# Patient Record
Sex: Female | Born: 1975 | Race: White | Hispanic: Yes | Marital: Single | Smoking: Never smoker
Health system: Southern US, Community
[De-identification: ages and names within clinical notes are randomized; demographics above are authoritative.]

## PROBLEM LIST (undated history)

## (undated) ENCOUNTER — Emergency Department (HOSPITAL_BASED_OUTPATIENT_CLINIC_OR_DEPARTMENT_OTHER): Admission: EM | Payer: No Typology Code available for payment source

## (undated) DIAGNOSIS — M549 Dorsalgia, unspecified: Secondary | ICD-10-CM

## (undated) DIAGNOSIS — M542 Cervicalgia: Secondary | ICD-10-CM

## (undated) HISTORY — DX: Dorsalgia, unspecified: M54.9

## (undated) HISTORY — PX: TUBAL LIGATION: SHX77

## (undated) HISTORY — DX: Cervicalgia: M54.2

---

## 2005-09-08 ENCOUNTER — Other Ambulatory Visit: Admission: RE | Admit: 2005-09-08 | Discharge: 2005-09-08 | Payer: Self-pay | Admitting: Gynecology

## 2007-08-03 ENCOUNTER — Other Ambulatory Visit: Admission: RE | Admit: 2007-08-03 | Discharge: 2007-08-03 | Payer: Self-pay | Admitting: Gynecology

## 2007-11-08 ENCOUNTER — Inpatient Hospital Stay (HOSPITAL_COMMUNITY): Admission: AD | Admit: 2007-11-08 | Discharge: 2007-11-08 | Payer: Self-pay | Admitting: Obstetrics and Gynecology

## 2007-11-19 ENCOUNTER — Inpatient Hospital Stay (HOSPITAL_COMMUNITY): Admission: AD | Admit: 2007-11-19 | Discharge: 2007-11-20 | Payer: Self-pay | Admitting: Family Medicine

## 2007-11-23 ENCOUNTER — Ambulatory Visit: Payer: Self-pay | Admitting: Obstetrics and Gynecology

## 2007-11-23 ENCOUNTER — Encounter: Payer: Self-pay | Admitting: Obstetrics and Gynecology

## 2011-02-16 NOTE — Group Therapy Note (Signed)
NAME:  Kristine Hayden, [name] NO.:  [id number]   MEDICAL RECORD NO.:  [id number]          PATIENT TYPE:  WOC   LOCATION:  [facility]                   FACILITY:  [facility]   PHYSICIAN:  Deirdre Christy Gentles, CNM       DATE OF BIRTH:  1976-08-06   DATE OF SERVICE:                                  CLINIC NOTE   REASON FOR VISIT:  Kristine Hayden is referred here from maternity  admissions where she has been seen twice this month for multiple  complaints, primarily constipation and muscle pain, neck, back, legs.  She was seen on February 4 for abdominal pain and diagnosed with muscle  spasm and constipation.  The she was treated with Toradol, Flexeril,  Colace and also received Rocephin for cervicitis.  Note, GC and  chlamydia were done were negative at that visit.  She was then seen  February 15 in MAU of for her constipation and had a manual disimpaction  of stool followed by an enema with good results.  She has a prescription  which she is taking for MiraLax.  However, she still says she is  constipated and she strains with stool and has hard stools.  She has a  bowel movement usually about every 2 days but has not had another bowel  movement since she was seen 4 days ago in MAU.  She is also concerned  with vaginal discharge which is white and itching which is on and off.  She also has dyspareunia.   GYN HISTORY:  Menses 14 x 28 x 5 with moderate flow and with moderate  dysmenorrhea.  She is using no contraception at present and says she  would like to have a fifth child but is not actively trying to get  pregnant now.   OBSTETRICAL HISTORY:  She is a G5, P 4-0-1-4 had SAB 6 months ago.  Children's ages are 35, 56, 4 and 3.  She probably had a Pap smear after  her 54-year-old's birth.  Is not sure.   SURGICAL HISTORY:  None.   FAMILY HISTORY:  Positive for heart disease and hypertension, and  negative for diabetes and cancer.   MEDICAL HISTORY:  She states she had pneumonia in the  past.   SOCIAL HISTORY:  Lives with father of her babies.  Is employed and a  nonsmoker.  Does not drink alcohol.  Negative history of abuse.  She  does report being under stress.   REVIEW OF SYMPTOMS:  Is positive as noted above in HPI as well as having  numbness in her fingers, pain in her muscles, fatigue, weight loss,  headaches, dizziness.  Chest pain dysuria and stress urinary  incontinence.   ALLERGIES:  None.   MEDICATIONS:  Nitrofurantoin, MiraLax, Flexeril, Colace.   PHYSICAL EXAM:  Anxious Hispanic female in no apparent distress.  HEENT:  WNL.  NECK:  No thyromegaly.  LUNGS:  Clear to auscultation bilaterally.  HEART:  RRR without murmur.  ABDOMEN:  Soft, flat, minimal diffuse tenderness which is nonfocal.  No  masses or organomegaly appreciated.  No CVA tenderness.  She does have  paraspinous tenderness  from cervical to sacral area, also tight muscles  in the suprascapular region.  Skin is without lesions.  EXTREMITIES:  2+ pulses without any edema.  BREASTS:  No discrete masses or lymphadenopathy.  PELVIC:  NEFG, physiologic discharge, which is white.  No lesions noted  at the vulvar introitus.  Vagina well rugated.  Cervix posterior clean  parous os.  Pap is done.  Uterus NSSP no CMT.  No adnexal tenderness or  masses.  No stool appreciated in rectal vault.   LABORATORY DATA:  The patient has had recent negative GC and negative  chlamydia this month.  She had a negative wet prep on February 4.  Urinalysis on 02/16 was significant for small LE, three to six WBCs and  many bacteria with few squamous cells, no culture was sent.  UPT was  negative at both of these visits February 4 and February 16.  Was not  repeated today.  Her CBC is WNL with hemoglobin of 13.2.   ASSESSMENT:  Stress and anxiety with secondary muscle tightness and  aching.  Constipation improved with treatment.  Normal GYN exam.   PLAN:  Interpreter was present only for the first part of this  visit and  we reviewed the medications and discuss constipation at length including  dietary measures, addition of fiber in her diet, as well as using  FiberCon or Metamucil if she wishes.  She is okay to use an occasional  Fleet's enema but meanwhile, should just stick to the MiraLax and the  Colace which she already has.  She is reassured about normalcy of her  exam and advised to try Tylenol with ibuprofen for her multiple aches  and pains.  Instructed in Kegel exercises, though her vaginal tone and  support are fair by exam.  She is advised to return in 4-6 weeks if she  is not any better.  Otherwise will come back for Pap smear in 1 year.           ______________________________  Caren Griffins, CNM     DP/MEDQ  D:  11/23/2007  T:  11/24/2007  Job:  [id number]

## 2011-06-25 LAB — URINE MICROSCOPIC-ADD ON

## 2011-06-25 LAB — URINALYSIS, ROUTINE W REFLEX MICROSCOPIC
Bilirubin Urine: NEGATIVE
Glucose, UA: NEGATIVE
Nitrite: NEGATIVE
Nitrite: NEGATIVE
Specific Gravity, Urine: 1.02
Urobilinogen, UA: 0.2
pH: 5.5
pH: 7

## 2011-06-25 LAB — CBC
MCHC: 34.6
MCV: 84.2
Platelets: 258

## 2011-06-25 LAB — GC/CHLAMYDIA PROBE AMP, GENITAL: GC Probe Amp, Genital: NEGATIVE

## 2011-06-25 LAB — WET PREP, GENITAL

## 2013-06-11 ENCOUNTER — Inpatient Hospital Stay (HOSPITAL_COMMUNITY)
Admission: AD | Admit: 2013-06-11 | Discharge: 2013-06-11 | Disposition: A | Discharge status: Home / Self Care | Payer: Self-pay | Source: Ambulatory Visit | Attending: Obstetrics & Gynecology | Admitting: Obstetrics & Gynecology

## 2013-06-11 ENCOUNTER — Encounter (HOSPITAL_COMMUNITY): Payer: Self-pay | Admitting: *Deleted

## 2013-06-11 DIAGNOSIS — L293 Anogenital pruritus, unspecified: Secondary | ICD-10-CM | POA: Insufficient documentation

## 2013-06-11 DIAGNOSIS — B3731 Acute candidiasis of vulva and vagina: Secondary | ICD-10-CM | POA: Insufficient documentation

## 2013-06-11 DIAGNOSIS — N949 Unspecified condition associated with female genital organs and menstrual cycle: Secondary | ICD-10-CM | POA: Insufficient documentation

## 2013-06-11 DIAGNOSIS — R300 Dysuria: Secondary | ICD-10-CM | POA: Insufficient documentation

## 2013-06-11 DIAGNOSIS — B373 Candidiasis of vulva and vagina: Secondary | ICD-10-CM

## 2013-06-11 DIAGNOSIS — N390 Urinary tract infection, site not specified: Secondary | ICD-10-CM

## 2013-06-11 DIAGNOSIS — R350 Frequency of micturition: Secondary | ICD-10-CM | POA: Insufficient documentation

## 2013-06-11 LAB — WET PREP, GENITAL
Clue Cells Wet Prep HPF POC: NONE SEEN
Trich, Wet Prep: NONE SEEN

## 2013-06-11 LAB — URINALYSIS, ROUTINE W REFLEX MICROSCOPIC
Bilirubin Urine: NEGATIVE
Glucose, UA: NEGATIVE mg/dL
Ketones, ur: NEGATIVE mg/dL
Specific Gravity, Urine: 1.02 (ref 1.005–1.030)
pH: 7 (ref 5.0–8.0)

## 2013-06-11 LAB — POCT PREGNANCY, URINE: Preg Test, Ur: NEGATIVE

## 2013-06-11 MED ORDER — CIPROFLOXACIN HCL 500 MG PO TABS: 500.0000 mg | ORAL_TABLET | Freq: Two times a day (BID) | ORAL | Status: DC

## 2013-06-11 MED ORDER — FLUCONAZOLE 150 MG PO TABS
150.0000 mg | ORAL_TABLET | ORAL | Status: AC
  Administered 2013-06-11: 150 mg via ORAL
  Filled 2013-06-11: qty 1

## 2013-06-11 MED ORDER — FLUCONAZOLE 150 MG PO TABS: 150.0000 mg | ORAL_TABLET | Freq: Once | ORAL | Status: DC

## 2013-06-11 NOTE — MAU Provider Note (Signed)
Chief Complaint: Vaginal Pain   None    SUBJECTIVE HPI: Kristine Hayden is a 37 y.o. G8P0020 at who presents to maternity admissions reporting pain with urination, urinary frequency, and feeling like she has a bump near where she urinates.  She also has vaginal itching.  She denies vaginal bleeding, h/a, dizziness, n/v, or fever/chills.  Hospital Spanish translator used for all communication.   History reviewed. No pertinent past medical history. Past Surgical History  Procedure Laterality Date  . Cesarean section     History   Social History  . Marital Status: Single    Spouse Name: N/A    Number of Children: N/A  . Years of Education: N/A   Occupational History  . Not on file.   Social History Main Topics  . Smoking status: Never Smoker   . Smokeless tobacco: Not on file  . Alcohol Use: No  . Drug Use: No  . Sexual Activity: Yes    Birth Control/ Protection: Surgical   Other Topics Concern  . Not on file   Social History Narrative  . No narrative on file   No current facility-administered medications on file prior to encounter.   No current outpatient prescriptions on file prior to encounter.   Allergies not on file  ROS: Pertinent items in HPI  OBJECTIVE Blood pressure 110/76, pulse 73, last menstrual period 05/23/2013. GENERAL: Well-developed, well-nourished female in no acute distress.  HEENT: Normocephalic HEART: normal rate RESP: normal effort ABDOMEN: Soft, non-tender EXTREMITIES: Nontender, no edema NEURO: Alert and oriented Pelvic exam: Cervix pink, visually closed, without lesion, small amount thick white discharge, vaginal walls and external genitalia normal, normal urethral opening, possibly with slight edema in area pt palpates as a "bump". Bimanual exam: Cervix 0/long/high, firm, anterior, neg CMT, uterus nontender, nonenlarged, adnexa without tenderness, enlargement, or mass  LAB RESULTS Results for orders placed during the  hospital encounter of 06/11/13 (from the past 24 hour(s))  URINALYSIS, ROUTINE W REFLEX MICROSCOPIC     Status: Abnormal   Collection Time    06/11/13 12:00 PM      Result Value Range   Color, Urine YELLOW  YELLOW   APPearance CLEAR  CLEAR   Specific Gravity, Urine 1.020  1.005 - 1.030   pH 7.0  5.0 - 8.0   Glucose, UA NEGATIVE  NEGATIVE mg/dL   Hgb urine dipstick MODERATE (*) NEGATIVE   Bilirubin Urine NEGATIVE  NEGATIVE   Ketones, ur NEGATIVE  NEGATIVE mg/dL   Protein, ur NEGATIVE  NEGATIVE mg/dL   Urobilinogen, UA 0.2  0.0 - 1.0 mg/dL   Nitrite NEGATIVE  NEGATIVE   Leukocytes, UA NEGATIVE  NEGATIVE  URINE MICROSCOPIC-ADD ON     Status: Abnormal   Collection Time    06/11/13 12:00 PM      Result Value Range   Squamous Epithelial / LPF FEW (*) RARE   RBC / HPF 3-6  <3 RBC/hpf   Bacteria, UA RARE  RARE  POCT PREGNANCY, URINE     Status: None   Collection Time    06/11/13 12:23 PM      Result Value Range   Preg Test, Ur NEGATIVE  NEGATIVE  WET PREP, GENITAL     Status: Abnormal   Collection Time    06/11/13 12:35 PM      Result Value Range   Yeast Wet Prep HPF POC NONE SEEN  NONE SEEN   Trich, Wet Prep NONE SEEN  NONE SEEN   Clue Cells  Wet Prep HPF POC NONE SEEN  NONE SEEN   WBC, Wet Prep HPF POC FEW (*) NONE SEEN    ASSESSMENT 1. UTI (lower urinary tract infection)   2. Vaginal candidiasis     PLAN Treatment based on symptoms Diflucan x1 dose in MAU Discharge home Urine sent for culture Cipro BID x3 days Drink plenty of water Return to MAU as needed    Medication List         ciprofloxacin 500 MG tablet  Commonly known as:  CIPRO  Take 1 tablet (500 mg total) by mouth 2 (two) times daily.         Medication List    Notice   You have not been prescribed any medications.       Sharen Counter Certified Nurse-Midwife 06/11/2013  1:40 PM

## 2013-06-11 NOTE — MAU Note (Signed)
Pt presents with complaints of burning, pain, and itching with urination.

## 2013-06-12 LAB — GC/CHLAMYDIA PROBE AMP: GC Probe RNA: NEGATIVE

## 2013-08-01 ENCOUNTER — Ambulatory Visit (INDEPENDENT_AMBULATORY_CARE_PROVIDER_SITE_OTHER): Payer: Self-pay | Admitting: Obstetrics & Gynecology

## 2013-08-01 ENCOUNTER — Encounter: Payer: Self-pay | Admitting: Obstetrics & Gynecology

## 2013-08-01 VITALS — BP 107/75 | HR 72 | Temp 98.2°F | Ht 61.0 in | Wt 145.2 lb

## 2013-08-01 DIAGNOSIS — N390 Urinary tract infection, site not specified: Secondary | ICD-10-CM

## 2013-08-01 DIAGNOSIS — L293 Anogenital pruritus, unspecified: Secondary | ICD-10-CM

## 2013-08-01 DIAGNOSIS — L292 Pruritus vulvae: Secondary | ICD-10-CM

## 2013-08-01 LAB — POCT URINALYSIS DIP (DEVICE)
Glucose, UA: NEGATIVE mg/dL
Protein, ur: 30 mg/dL — AB
Specific Gravity, Urine: >=1.03 (ref 1.005–1.030)
Urobilinogen, UA: 1 mg/dL (ref 0.0–1.0)

## 2013-08-01 MED ORDER — SULFAMETHOXAZOLE-TRIMETHOPRIM 800-160 MG PO TABS: 1.0000 | ORAL_TABLET | Freq: Two times a day (BID) | ORAL | Status: DC

## 2013-08-01 NOTE — Progress Notes (Signed)
Subjective:     Patient ID: Kristine Hayden, female   DOB: 1976-02-03, 37 y.o.   MRN: [id number]  HPI Pt c/o dysuria and urinary freq for several months.  She was seen in the ED and given an atbx- she is unsure whether it worked.  She c/o throbbing in the vaginal area.  The area that she points to is the clitoris.  She reports that she scratches in that area because it is so severe.  No past medical history on file.  Past Surgical History  Procedure Laterality Date  . Cesarean section      No current outpatient prescriptions on file prior to visit.   No current facility-administered medications on file prior to visit.   No Known Allergies     Review of Systems     Objective:   Physical Exam BP 107/75  Pulse 72  Temp(Src) 98.2 F (36.8 C) (Oral)  Ht 5\' 1"  (1.549 m)  Wt 145 lb 3.2 oz (65.862 kg)  BMI 27.45 kg/m2  LMP 07/23/2013 Pt in NAD Abd: soft NT, ND GU: EGBUS: no lesions; skin intact  The area this she points to show no abnormality  UA: mod blood, trace Leuk   Assessment:     Possible UTI Vulvar itching- normal exam      Plan:     Bactrim DS 1 po bid x 7 days Urine cx Hydrocortisone cream 1% to affected area tid  F/u 1 month Pt encouraged NOT to scratch the area before next visit

## 2013-08-01 NOTE — Patient Instructions (Signed)
Infeccin urinaria  (Urinary Tract Infection)  La infeccin urinaria puede ocurrir en Corporate treasurer del tracto urinario. El tracto urinario es un sistema de drenaje del cuerpo por el que se eliminan los desechos y el exceso de [place]. El tracto urinario est formado por dos riones, dos urteres, la vejiga y Engineer, mining. Los riones son rganos que tienen forma de frijol. Cada rin tiene aproximadamente el tamao del puo. Estn situados debajo de las [place], uno a cada lado de la columna vertebral CAUSAS  La causa de la infeccin son los microbios, que son organismos microscpicos, que incluyen hongos, virus, y bacterias. Estos organismos son tan pequeos que slo pueden verse a travs del microscopio. Las bacterias son los microorganismos que ms comnmente causan infecciones urinarias.  SNTOMAS  Los sntomas pueden variar segn la edad y el sexo del paciente y por la ubicacin de la infeccin. Los sntomas en las mujeres jvenes incluyen la necesidad frecuente e intensa de orinar y una sensacin dolorosa de ardor en la vejiga o en la uretra durante la miccin. Las mujeres y los hombres mayores podrn sentir cansancio, temblores y debilidad y Futures trader musculares y Engineer, mining abdominal. Si tiene [place], puede significar que la infeccin est en los riones. Otros sntomas son dolor en la espalda o en los lados debajo de las [place], nuseas y vmitos.  DIAGNSTICO  Para diagnosticar una infeccin urinaria, el mdico le preguntar acerca de sus sntomas. [employer] una [place] de [place]. La muestra de orina se analiza para Engineer, manufacturing bacterias y glbulos blancos de Risk manager. Los glbulos blancos se forman en el organismo para ayudar a Artist las infecciones.  TRATAMIENTO  Por lo general, las infecciones urinarias pueden tratarse con medicamentos. Debido a que la [employer] de las infecciones son causadas por bacterias, por lo general pueden tratarse con antibiticos. La eleccin del  antibitico y la duracin del tratamiento depender de sus sntomas y el tipo de bacteria causante de la infeccin.  INSTRUCCIONES PARA EL CUIDADO EN EL HOGAR   Si le recetaron antibiticos, tmelos exactamente como su mdico le indique. Termine el medicamento aunque se sienta mejor despus de haber tomado slo algunos.  Beba gran cantidad de lquido para mantener la orina de tono claro o color amarillo plido.  Evite la cafena, el t y las [address]. Estas sustancias irritan la vejiga.  Vaciar la vejiga con frecuencia. Evite retener la orina durante largos perodos.  Vace la vejiga antes y despus de Management consultant.  Despus de mover el intestino, las mujeres deben higienizarse la regin perineal desde adelante hacia atrs. Use slo un papel tissue por vez. SOLICITE ATENCIN MDICA SI:   Siente dolor en la espalda.  Le sube la fiebre.  Los sntomas no mejoran luego de [address]. SOLICITE ATENCIN MDICA DE INMEDIATO SI:   Siente dolor intenso en la espalda o en la zona inferior del abdomen.  Comienza a sentir escalofros.  Tiene nuseas o vmitos.  Tiene una sensacin continua de quemazn o molestias al [employer]. ASEGRESE DE QUE:   Comprende estas instrucciones.  Controlar su enfermedad.  Solicitar ayuda de inmediato si no mejora o empeora. Document Released: 06/30/2005 Document Revised: 06/14/2012 [facility] Patient Information 2014 [place], [place].   Hydrocortisone 1% apply to affected area 3x a day

## 2013-08-22 ENCOUNTER — Ambulatory Visit: Payer: Self-pay | Admitting: Obstetrics & Gynecology

## 2013-09-05 ENCOUNTER — Ambulatory Visit: Payer: Self-pay | Admitting: Obstetrics & Gynecology

## 2013-09-06 ENCOUNTER — Ambulatory Visit: Payer: Self-pay | Admitting: Obstetrics & Gynecology

## 2013-09-07 ENCOUNTER — Ambulatory Visit (INDEPENDENT_AMBULATORY_CARE_PROVIDER_SITE_OTHER): Payer: Self-pay | Admitting: Obstetrics & Gynecology

## 2013-09-07 ENCOUNTER — Encounter: Payer: Self-pay | Admitting: Obstetrics & Gynecology

## 2013-09-07 VITALS — BP 110/78 | HR 69 | Temp 97.3°F | Ht 60.0 in | Wt 147.6 lb

## 2013-09-07 DIAGNOSIS — R350 Frequency of micturition: Secondary | ICD-10-CM

## 2013-09-07 DIAGNOSIS — R3129 Other microscopic hematuria: Secondary | ICD-10-CM

## 2013-09-07 DIAGNOSIS — N949 Unspecified condition associated with female genital organs and menstrual cycle: Secondary | ICD-10-CM

## 2013-09-07 DIAGNOSIS — R102 Pelvic and perineal pain: Secondary | ICD-10-CM

## 2013-09-07 LAB — POCT URINALYSIS DIP (DEVICE)
Bilirubin Urine: NEGATIVE
Glucose, UA: NEGATIVE mg/dL
Nitrite: NEGATIVE
Specific Gravity, Urine: 1.015 (ref 1.005–1.030)
pH: 7 (ref 5.0–8.0)

## 2013-09-07 MED ORDER — TRAMADOL HCL 50 MG PO TABS: 50.0000 mg | ORAL_TABLET | Freq: Four times a day (QID) | ORAL | Status: DC | PRN

## 2013-09-07 MED ORDER — TOLTERODINE TARTRATE ER 2 MG PO CP24: 2.0000 mg | ORAL_CAPSULE | Freq: Every day | ORAL | Status: DC

## 2013-09-07 NOTE — Patient Instructions (Signed)
Vejiga hiperactiva - Adultos  (Overactive Bladder, Adult)  La vejiga tiene dos funciones que son totalmente opuestas. Una de ellas es relajarse y agrandarse para Psychologist, clinical orina (se llena como un globo), y la otra es contraerse y apretar hacia abajo de modo que pueda vaciar la orina que se ha almacenado. El correcto funcionamiento de la vejiga es una mezcla compleja de [place] funciones. El llenado y vaciado de la vejiga puede estar influenciado por:   La vejiga.  La mdula espinal.  El cerebro.  Los nervios que Zenaida Niece a la vejiga.  Otros rganos estrechamente relacionados con la vejiga, como la prstata en los hombres y la vagina en las mujeres. A medida que la vejiga se llena de orina, enva seales nerviosas al cerebro para informarle que tiene que orinar. La miccin normal requiere que la vejiga se contraiga hacia abajo con la fuerza suficiente como para vaciarla, pero tambin requiere que se contraiga el tiempo suficiente como para finalizar la accin. Adems, los msculos del esfnter, que normalmente impiden las fugas de [place], tambin deben relajarse para que la orina pueda pasar. Es necesaria la coordinacin entre el msculo de la vejiga que empuja hacia abajo y los msculos del esfnter que se relajan para que todo ocurra con normalidad.  En una vejiga hiperactiva los msculos se contraen de forma inesperada e involuntaria y esto provoca la necesidad urgente de Geographical information systems officer. La respuesta normal es tratar de Insurance risk surveyor orina contrayendo los msculos del esfnter. A veces, la vejiga se contrae con tanta fuerza que los msculos del esfnter no pueden impedir que la orina pase y se produce la incontinencia. Este tipo de incontinencia se llama incontinencia a la urgencia.  Cuando la vejiga es hiperactiva puede haber situaciones embarazosas. Puede interferir en vivir la vida de la manera que usted desea. Muchas personas piensan que es algo que deben soportar debido al envejecimiento o a problemas de  [place]. Sin embargo, existen tratamientos que pueden ayudar a Radio producer su vida menos complicada y ms agradable.  CAUSAS  Son [employer] factores que pueden producir una vejiga hiperactiva. Ellos son:   Infeccin del tracto urinario o infeccin de los tejidos cercanos, como la prstata.  Agrandamiento de la prstata.  En las mujeres, los embarazos mltiples o una ciruga en el tero o la uretra.  Clculos , inflamacin o tumores en la vejiga.  La cafena.  El alcohol.  Medicamentos. Por ejemplo, los diurticos (medicamentos que ayudan al cuerpo a Animator del exceso de lquido) aumentan la produccin de [place]. Algunos medicamentos que deben tomarse con mucho lquido.  Debilidad muscular o nerviosa. Esto podra ser el resultado de una lesin de la mdula espinal, un ictus, esclerosis mltiple o enfermedad de Parkinson.  La diabetes puede producir un alto volumen de orina que llena la vejiga tan rpidamente que el impulso normal de orinar se activa muy intensamente. SNTOMAS   Prdida del control de la vejiga. Siente necesidad de Geographical information systems officer y no Sport and exercise psychologist.  Urgencia repentina de orinar.  Orina 8 o ms veces por da.  Levantarse para [employer] o ms veces por noche. DIAGNSTICO  Para diagnosticar vejiga hiperactiva, el mdico probablemente:   Preguntar sobre los sntomas que ha notado.  Preguntar acerca de su salud en general. Incluir preguntas sobre cualquier medicamento que est tomando.  Realizar un examen fsico. Esto ayudar a determinar si hay obstrucciones evidentes u otros problemas.  Indicar algunos exmenes. Pueden incluir:  Un anlisis de sangre para detectar diabetes u otros  problemas de salud que podran estar contribuyendo al problema.  Anlisis de [place]. Incluir la medicin del flujo de [place] y la presin sobre la vejiga.  Un estudio del sistema neurolgico (cerebro, mdula espinal y nervios). Este es el sistema que detecta la necesidad de Geographical information systems officer. Algunas  de [employer] son las pruebas de flujo, pruebas de presin en la vejiga y mediciones elctricas del msculo del Corporate investment banker.  Un estudio de la vejiga para comprobar si se vaca completamente al [employer]oco[employer]illips.  Una citoscopa. En este estudio se [place] un tubo delgado con una pequea cmara. Ofrece la visin del interior de la uretra y la vejiga para ver si hay algn problema.  Diagnstico por imgenes. Le administrarn una sustancia de contraste y The [employer]JX Companies pedirn que orine. Se toman radiografas para ver el funcionamiento de la vejiga. TRATAMIENTO  Una vejiga hiperactiva puede tratarse de [employer]. El tratamiento depende de la causa. Tambin vara si su caso es leve o grave. Generalmente el tratamiento se puede administrar en el consultorio mdico o en la clnica. Asegrese de [employer] opciones con su mdico. Las opciones pueden ser:   Tratamientos conductuales. No incluyen medicamentos ni ciruga:  Entrenamiento de la vejiga. En este caso debe seguir un programa de horarios para orinar a intervalos regulares. Esto le ayudar a aprender a Chief Operating Officer las ganas de Geographical information systems officer. Al principio, es posible que le indiquen que espere unos minutos despus de sentir deseos de Geographical information systems officer. Con el tiempo, usted debe ser capaz de programar ir al bao con ms de una hora de distancia.  Ejercicios de Kegel. Estos ejercicios fortalecen los msculos del piso plvico, que sostienen la vejiga. Al tonificar estos msculos, podr controlar la necesidad de orinar, incluso si los msculos de la vejiga son hiperactivos. Un especialista le ensear cmo hacer estos ejercicios correctamente. Se requerir prctica diaria.  Prdida de peso. Si usted es obeso o tiene sobrepeso, perder peso podra mejorar la vejiga hiperactiva. Hable con su mdico acerca de cuntos kilos debe perder. Tambin pregunte si hay un programa o mtodo especfico que funcione mejor para usted.  Cambio de dieta. Podran sugerirle esta opcin si el  estreimiento empeora su vejiga hiperactiva. El mdico o un nutricionista pueden explicarle las formas de modificar su dieta para Educational psychologist. Otras personas mejorarn si toman menos cafena o alcohol. En algunos casos es necesario beber menos lquidos.  Proteccin. Esto no es Art therapist. Sin embargo, podra usar apsitos especiales para detener eventuales prdidas mientras espera que otros tratamientos sean efectivos. Esto le ayudar a evitar situaciones vergonzantes.  Tratamientos fsicos.  Estimulacin elctrica. Los electrodos envan [employer] Sciences Corporation a los nervios o a los msculos que ayudan a Scientist, physiological vejiga. El objetivo es fortalecerlos. En algunos casos esto se hace con los electrodos fuera del cuerpo. O bien, pueden colocarlos en el interior del cuerpo (implante). Este tratamiento puede demorar varios meses en surtir [employer].  Medicamentos. stos se utilizan generalmente junto con otros tratamientos. Hay varios medicamentos disponibles. Algunos se inyectan en los msculos que intervienen en la miccin. Otros vienen en forma de pldora. Los medicamentos que se prescriben incluyen:  Anticolinrgicos. Estos medicamentos bloquean las seales que los nervios envan a la vejiga. Impiden la liberacin de la orina cuando no es el momento [place]. Los investigadores consideran que los medicamentos podran ayudar tambin de [place].  Imipramina. Es un antidepresivo. Relaja msculos de la vejiga.  Botox. Todava se encuentra en etapa experimental. Algunas personas creen que al inyectarlo en  los msculos de la vejiga, estos se relajan para que trabajen con mayor normalidad. Tambin se ha inyectado en el msculo del esfnter cuando no se abre correctamente. Sin embargo, se trata de una solucin transitoria. Tambin, podra Dillard's, sobre todo Charles Schwab.  Ciruga.  Puede implantarse un dispositivo para ayudar a [employer]. Funciona en los  nervios que envan seales cuando hay necesidad de orinar.  La ciruga a veces es necesaria con Advice worker. Si se implantan electrodos, se hace a travs de la [place].  A veces la reparacin debe hacerse a travs de la [place]. Por ejemplo, el tamao de la vejiga se puede cambiar. Generalmente slo en casos graves. INSTRUCCIONES PARA EL CUIDADO EN EL HOGAR   Tome todos los medicamentos que el mdico le recete o Radiographer, therapeutic. Siga cuidadosamente las indicaciones.  Haga los cambios de estilo de vida que se le indican. Pueden incluir:  Beber menos lquido o beber en diferentes momentos del da. Si necesita orinar con frecuencia durante la noche, por ejemplo, es posible que tenga que dejar de tomar lquidos temprano por la noche.  Reducir el consumo de cafena o alcohol. Ambos pueden empeorar la vejiga hiperactiva. La cafena se encuentra en el caf, el t y [place].  Hacer ejercicios de Kegel para fortalecer los msculos.  Bajar de peso, si se lo indican.  Consuma una dieta saludable y equilibrada. Esto le ayudar a Chief Strategy Officer.  Lleve un diario o un registro. Es posible que se le pida que registre la cantidad que bebe y [place], y tambin cuando se sienta la necesidad de Geographical information systems officer.  Aprenda cmo cuidar los implantes u otros dispositivos, tales como pesarios. SOLICITE ATENCIN MDICA SI:   La vejiga hiperactiva empeora.  Siente dolor o tiene irritacin al ConocoPhillips.  Observa sangre en la orina.  Tiene preguntas sobre cualquier medicamento o dispositivos que su mdico indique.  Nota sangre, pus o hinchazn en el lugar en que le han realizado las pruebas o procedimientos de [place].  La temperatura oral se eleva sin motivo por arriba de 102F (38,9C). SOLICITE ATENCIN MDICA DE INMEDIATO SI:  La temperatura oral le sube a ms de 102 (38,9C) y no puede bajarla con medicamentos.  Document Released: 09/06/2012 [facility] Patient Information 2014 [place],  [place]. Poliaquiuria  (Urinary Frequency)  El nmero de veces que orina una persona sana depende de la cantidad de lquido que ingiere y la cantidad de lquido que pierde. Cuando hace calor y hay mucha humedad, la persona transpira ms y la respiracin es ms rpida. Estos factores disminuyen la frecuencia de la miccin que se considera normal.  La cantidad de lquido que se bebe es fcil de Chief Strategy Officer, pero la cantidad de lquido que se pierde es ms difcil de calcular.  El lquido se pierde en dos formas:   La prdida perceptible de lquidos se mide por la cantidad de orina que se elimina. Tambin hay prdida de lquido tambin con la diarrea.  La prdida imperceptible de lquidos es ms difcil de medir. La causa es la evaporacin. La prdida insensible de lquido se produce a travs de la respiracin y la sudoracin. Por lo general oscila alrededor de Building services engineer. En temperaturas y niveles de [place] normales una persona promedio puede orinar 4-7 veces en un perodo de 24 horas. La necesidad de orinar con ms frecuencia podra indicar que hay un problema. Si una persona orina entre 4 y 7 veces en 24 horas y  elimina grandes volmenes cada vez, podra indicar un problema diferente del que orine 4 a 7 veces al da y elimine pequeos volmenes. El momento en que orina tambin es importante. Generalmente se [employer] de vigilia. Levantarse con frecuencia por la noche puede indicar algunos problemas.  CAUSAS  La vejiga es el rgano que se encuentra en la zona baja del abdomen y que contiene la [place]. Como un globo, se hincha un poco a medida que se llena. Las terminales nerviosas lo perciben y envan la informacin de que es hora de ir al bao. Hay una serie de causas por las que se siente la necesidad de Geographical information systems officer con ms frecuencia de lo habitual. Ellos son:   Infeccin del tracto urinario. Esto generalmente se asocia con otros sntomas tales como ardor al ConocoPhillips.  En hombres, los  problemas de la prstata (una glndula del tamao de una nuez que se encuentra cerca del conducto por el que se elimina la orina del organismo). Hay dos razones por las que la prstata puede causar un aumento en la frecuencia de la miccin:  El agrandamiento de la prstata que no deja que la vejiga se vace bien. Si la vejiga se vaca a medias slo tiene la mitad de la capacidad de llenarla antes de orinar de [place].  Los nervios de la vejiga se vuelven ms hipersensibles a un aumento del tamao de la prstata, incluso si la vejiga se vaca completamente.  Embarazo.  Obesidad. El exceso de peso causa ms problemas en las mujeres que en los hombres.  Clculos u otros problemas en la vejiga.  La cafena.  Alcohol.  Medicamentos. Por ejemplo, los medicamentos que permiten eliminar el exceso de lquido del organismo (diurticos) aumentan la produccin de [place]. Algunos medicamentos deben tomarse con mucho lquido.  Debilidad muscular o nerviosa. Podra ser el resultado de una lesin en la mdula espinal, un ictus, esclerosis mltiple o la enfermedad de Parkinson.  Cuando se ha sufrido diabetes durante mucho tiempo, puede haber una disminucin de la sensacin de la vejiga. Esta prdida de sensibilidad hace que sea ms difcil detectar que hay que vaciar la vejiga. Durante muchos aos la vejiga se expande por sobrellenado constante. Esto debilita sus msculos de modo que la vejiga no se vaca bien y tiene menos capacidad para llenarse nuevamente.  Cistitis intersticial (tambin llamado sndrome de vejiga dolorosa). La enfermedad se desarrolla debido a que los tejidos que recubren el interior de la vejiga se inflaman (la inflamacin es la manera que tiene el organismo de Publishing rights manager a una lesin o infeccin). Causa dolor y necesidad frecuente de Geographical information systems officer. Ocurre con ms frecuencia en mujeres que en hombres. DIAGNSTICO   Para diagnosticar la causa de la poliaquiuria, el mdico:  Le preguntar [employer] sntomas.  Preguntar acerca de su salud en general. Incluir preguntas sobre los medicamentos que est tomando.  Le har un examen fsico.  Indicar algunos estudios. Estos pueden incluir:  Un anlisis de sangre para Engineer, manufacturing diabetes u otros problemas de salud que podran estar contribuyendo al problema.  Anlisis de [place]. Anlisis de [place] para medir el flujo de [place] y la presin sobre la vejiga.  Estudios del sistema neurolgico (el cerebro, la mdula espinal y los nervios). Este es el sistema que detecta la sensacin de Geographical information systems officer.  Estudios de la vejiga para verificar si se vaca por completo al ConocoPhillips.  [place]. En este estudio se [place] un tubo delgado con una pequea cmara. Podr observarse el interior de  la uretra y la vejiga para ver si hay problemas.  Diagnstico por imgenes. Le administrarn una sustancia de contraste y The TJX Companies pedirn que orine Luego se toman radiografas de la vejiga para ver su funcionamiento. TRATAMIENTO  Es importante que sea evaluado para determinar si la cantidad o la frecuencia con la que orina es anormal o inusual. Si se comprueba que no es normal, debe determinarse la causa y por lo general se puede hallar con facilidad. Segn sea la causa, el tratamiento podra incluir medicamentos, la estimulacin de los nervios o [place].  No hay muchas cosas que usted pueda hacer por su cuenta para modificar la poliaquiuria. Es importante equilibrar la ingestin de lquidos necesaria para compensar su actividad y Retail buyer. Los problemas mdicos sern diagnosticados y tratados por su mdico. No hay entrenamiento de la vejiga en particular, como los ejercicios de Kegel que usted pueda hacer para mejorar la poliaquiuria. Este es un ejercicio que normalmente deben [employer] Corporation personas que tienen prdidas de [place] cuando se ren, tosen o estornudan.  INSTRUCCIONES PARA EL CUIDADO EN EL HOGAR   Tome todos los medicamentos que su mdico le ha recetado o  sugerido. Siga cuidadosamente las indicaciones.  Realice cambios en su estilo de vida, segn las indicaciones. Estos pueden incluir:  Product manager menos lquidos o beber en diferentes momentos del da. Por ejemplo, si necesita orinar con frecuencia durante la noche, deber dejar de tomar lquidos temprano a la noche.  Reducir el consumo de cafena o de alcohol. Ambos aumentan la necesidad de orinar ms de lo normal. La cafena se encuentra en las gaseosas, el t y el chocolate.  Baje de peso, si se lo indican.  Lleve un diario o registro. Posiblemente le pedirn que registre la cantidad que bebe y [place], y el momento en que sienta la necesidad de Geographical information systems officer. Esto tambin lo ayudar a evaluar si el tratamiento que le ha indicado el mdico funciona. SOLICITE ATENCIN MDICA SI:   La necesidad de orinar con frecuencia aumenta.  Se siente ms dolor o irritacin al [employer]ocoPhillips.  Observa sangre en la orina.  Usted tiene preguntas sobre cualquier medicamento que su mdico ha recomendado.  Observa sangre, pus, aumento de la [employer]s lugares en que le hicieron las pruebas o el [place].  La temperatura eleva por encima de 100,26F (38,1C). SOLICITE ATENCIN MDICA DE INMEDIATO SI:  La temperatura eleva a ms de 102,26F (38,9C).  Document Released: 06/14/2012 [facility] Patient Information 2014 [place], [place].

## 2013-09-07 NOTE — Progress Notes (Signed)
Subjective:     Patient ID: Kristine Hayden, female   DOB: 1976/09/29, 37 y.o.   MRN: [id number]  HPI  Pt returns for f/u with the same complaints.  She denies improvement with atbx.  She still c/o urinary freq and a feeling of incomplete bladder emptying.      Review of Systems     Objective:   Physical Exam BP 110/78  Pulse 69  Temp(Src) 97.3 F (36.3 C) (Oral)  Ht 5' (1.524 m)  Wt 147 lb 9.6 oz (66.951 kg)  BMI 28.83 kg/m2  LMP 08/28/2013 Pt in NAD  Urine dipstick shows positive for WBC's.  Micro exam: not done.       Assessment:     Microscopic hematuria on 3 separate visits- no evidence of UTI   Suspect overactive bladder      Plan:     Refer to primary care provider Detrol LA 2mg  daily Ultram 50mg  q 6 hours prn F/u 2 months or sooner prn

## 2013-09-12 ENCOUNTER — Telehealth: Payer: Self-pay

## 2013-09-12 NOTE — Telephone Encounter (Signed)
Hiiiiiiiii Dr. Erin Fulling,  So this patient walked in to the GYN clinic today. I was covering for Erie Noe. She is spanish speaking. She came in because she needs you to, if all possible, call in something different than the Detrol LA 2 mg. This medicine is to expensive and she can't afford it. Has no insurance. Is there anything cheaper she can get? Her pharmacy is [facility] in [place].   Thanks :)

## 2013-09-13 ENCOUNTER — Other Ambulatory Visit: Payer: Self-pay | Admitting: Obstetrics & Gynecology

## 2013-09-13 ENCOUNTER — Other Ambulatory Visit: Payer: Self-pay

## 2013-09-13 ENCOUNTER — Telehealth: Payer: Self-pay

## 2013-09-13 DIAGNOSIS — N393 Stress incontinence (female) (male): Secondary | ICD-10-CM

## 2013-09-13 MED ORDER — OXYBUTYNIN CHLORIDE 5 MG PO TABS: 5.0000 mg | ORAL_TABLET | Freq: Two times a day (BID) | ORAL | Status: DC

## 2013-09-13 NOTE — Telephone Encounter (Signed)
Called pt. With Tobi Bastos to figure out what pharmacy Pt. Would want her prescription of Ditropan sent to. Pt. Stated [facility] on [address] Pharmacy added to her medication list and medication e-prescribed to that pharmacy. Informed pt. That she can now pick that up at her pharmacy. Pt. Verbalized understanding. Pt. Also asked about her neurology referral. Informed pt. That they should call her with an appointment; if she does not here from them by the first of the year to give [place] a call. Pt. Verbalized understanding and had no other questions or concerns.

## 2013-09-13 NOTE — Telephone Encounter (Signed)
Please see Rosary Lively note to me.  This is a less expensive version of the same medication.  This is the LEAST expensive version.  Detrol LA was too expensive.  Ordered Ditropan.

## 2013-10-10 ENCOUNTER — Ambulatory Visit: Payer: Self-pay | Admitting: Family Medicine

## 2013-10-19 ENCOUNTER — Encounter: Payer: Self-pay | Admitting: Family Medicine

## 2013-10-19 ENCOUNTER — Other Ambulatory Visit (HOSPITAL_COMMUNITY)
Admission: RE | Admit: 2013-10-19 | Discharge: 2013-10-19 | Disposition: A | Discharge status: Home / Self Care | Payer: Self-pay | Source: Ambulatory Visit | Attending: Family Medicine | Admitting: Family Medicine

## 2013-10-19 ENCOUNTER — Ambulatory Visit (INDEPENDENT_AMBULATORY_CARE_PROVIDER_SITE_OTHER): Payer: Self-pay | Admitting: Family Medicine

## 2013-10-19 VITALS — BP 109/77 | HR 67 | Temp 97.8°F | Resp 16 | Ht 60.04 in | Wt 147.0 lb

## 2013-10-19 DIAGNOSIS — R300 Dysuria: Secondary | ICD-10-CM

## 2013-10-19 DIAGNOSIS — R319 Hematuria, unspecified: Secondary | ICD-10-CM | POA: Insufficient documentation

## 2013-10-19 DIAGNOSIS — Z113 Encounter for screening for infections with a predominantly sexual mode of transmission: Secondary | ICD-10-CM | POA: Insufficient documentation

## 2013-10-19 DIAGNOSIS — R3129 Other microscopic hematuria: Secondary | ICD-10-CM

## 2013-10-19 DIAGNOSIS — R102 Pelvic and perineal pain: Secondary | ICD-10-CM

## 2013-10-19 DIAGNOSIS — N390 Urinary tract infection, site not specified: Secondary | ICD-10-CM

## 2013-10-19 LAB — POCT URINALYSIS DIPSTICK
Bilirubin, UA: NEGATIVE
Glucose, UA: NEGATIVE
Ketones, UA: NEGATIVE
LEUKOCYTES UA: NEGATIVE
NITRITE UA: NEGATIVE
PH UA: 7
PROTEIN UA: NEGATIVE
Spec Grav, UA: 1.02
Urobilinogen, UA: 0.2

## 2013-10-19 LAB — POCT WET PREP (WET MOUNT): CLUE CELLS WET PREP WHIFF POC: NEGATIVE

## 2013-10-19 LAB — POCT UA - MICROSCOPIC ONLY

## 2013-10-19 LAB — POCT URINE PREGNANCY: Preg Test, Ur: NEGATIVE

## 2013-10-19 NOTE — Patient Instructions (Signed)
It was nice to meet you today!  I am referring you to urologist for your urinary problems. You will get a phone call to schedule this appointment.  We are also checking an ultrasound of your abdomen and pelvis. Please schedule a separate visit to discuss the chest discomfort/breathing problems you've had.  Be well, Dr. Pollie MeyerMcIntyre

## 2013-10-19 NOTE — Progress Notes (Signed)
Patient ID: Kristine Hayden, female   DOB: [birth year], 38 y.o.   MRN: [id number]   HPI:  Patient presents today for a new patient appointment to establish general primary care, also to discuss urinary problems.  Urinary problems: has discomfort with urination. She also reports feeling like there is a little ball inside of her, and it itches and hurts. It happens all the time. Feels like a ball on outside. She can feel it if she touches in her genital area. Also has stabbing sensation there. Sometimes feels weak. Has not had any fevers. Has occasional back pain which comes and goes. Has had some yellow/white mucousy vaginal discharge. Reports periods as irregular, but states she had one on November 18th and then again on December 20th. She has had her tubes tied and has had just one sexual partner in the last year. She is able to control her urine but does have to go frequently. She has been taking a medicine prescribed to her by GYN for urinary incontinence (does not know which medicine) but states it is not really helping at all. She also endorses discomfort with intercourse. Has not noticed any blood in her urine.  ROS: See HPI. ROS also positive for chest pain, abdominal pain, dysuria, headache, frequent urination, constipation, muscular cramping or pain. (Advised pt we could not address all these concerns at this visit but did briefly discuss chest pain with her. It is not frequent and does not occur with exertion. She does have any pain right now.)  Past Medical Hx:  -no chronic medical problems  Past Surgical Hx:  -cesarean section x2  Family Hx: updated in Epic Brother with diabetes Mom with hypertension, chronic bronchitis No hx of cancer  Social Hx:  Lives at home with husband and six children. Owns a car. Does not smoke, drink, or do drugs. Feels safe in her relationship. Denies mood problems, SI/HI. Spanish is primary language.  Health Maintenance:  [facility], but was told to repeat in one year -has had flu shot this year  PHYSICAL EXAM: BP 109/77  Pulse 67  Temp(Src) 97.8 F (36.6 C) (Oral)  Resp 16  Ht 5' 0.04" (1.525 m)  Wt 147 lb (66.679 kg)  BMI 28.67 kg/m2  SpO2 98%  LMP 09/26/2013 Gen: NAD, pleasant, cooperative HEENT: NCAT, MMM Heart: RRR Lungs: CTAB, NWOB Abdomen: soft, nontender to palpation GU: external genitalia normal in appearance. Patient points to her clitoris as source of pain, but no abnormality apparent. No lesions. Vagina is moist without significant abnormal discharge, cervix unremarkable in appearance. Some slight tenderness with manipulation of cervix, but no adnexal masses or specific adnexal pain. Neuro: grossly nonfocal, speech intact  ASSESSMENT/PLAN:  # Pelvic pain: etiology unclear. History somewhat nonspecific. Pregnancy test negative today. Does not appear to have UTI based on UA obtained today. Will check wet prep, gc/chlamydia as further markers for infection. Pt points to her clitoris as the area where she has a "bump" that is painful, but I do not see any abnormalities on her exam. Will obtain ultrasound of pelvis to evaluate for anatomical abnormalities. Precepted with Dr. Lum BabeEniola who agrees with this plan.  # Microscopic hematuria: UA shows moderate blood and based on chart review, has had blood present in the past. Needs further workup for microscopic hematuria. Will send specimen for urine cytology, obtain u/s abdomen to evaluate kidneys, and refer to urology for additional evaluation.  FOLLOW UP: Referring to urology for microscopic  hematuria. Schedule separate appointment to discuss other concerns identified on ROS.  [place] J. Pollie Meyer, MD [facility]

## 2013-10-23 ENCOUNTER — Ambulatory Visit (HOSPITAL_COMMUNITY): Payer: Self-pay

## 2013-10-23 ENCOUNTER — Other Ambulatory Visit (HOSPITAL_COMMUNITY): Payer: Self-pay

## 2013-10-23 ENCOUNTER — Ambulatory Visit (HOSPITAL_COMMUNITY)
Admission: RE | Admit: 2013-10-23 | Discharge: 2013-10-23 | Disposition: A | Discharge status: Home / Self Care | Payer: Self-pay | Source: Ambulatory Visit | Attending: Family Medicine | Admitting: Family Medicine

## 2013-10-23 DIAGNOSIS — R300 Dysuria: Secondary | ICD-10-CM

## 2013-10-23 DIAGNOSIS — R109 Unspecified abdominal pain: Secondary | ICD-10-CM | POA: Insufficient documentation

## 2013-10-23 DIAGNOSIS — K7689 Other specified diseases of liver: Secondary | ICD-10-CM | POA: Insufficient documentation

## 2013-10-25 ENCOUNTER — Encounter: Payer: Self-pay | Admitting: Family Medicine

## 2013-10-25 DIAGNOSIS — R3129 Other microscopic hematuria: Secondary | ICD-10-CM | POA: Insufficient documentation

## 2013-10-25 NOTE — Assessment & Plan Note (Signed)
UA shows moderate blood and based on chart review, has had blood present in the past. Needs further workup for microscopic hematuria. Will send specimen for urine cytology, obtain u/s abdomen to evaluate kidneys, and refer to urology for additional evaluation.

## 2013-10-30 ENCOUNTER — Telehealth: Payer: Self-pay | Admitting: Family Medicine

## 2013-10-30 MED ORDER — FLUCONAZOLE 150 MG PO TABS: 150.0000 mg | ORAL_TABLET | Freq: Once | ORAL | Status: DC

## 2013-10-30 NOTE — Telephone Encounter (Signed)
Marines,  This pt is Spanish speaking. Can you call this patient and let her know:  - her wet prep shows some yeast, which I don't think is the main cause of her itching/discomfort but could be contributing so I think we should treat it. I sent in a medicine called fluconazole (one pill). - her ultrasound showed a cyst in her left ovary which is likely a relatively normal finding, and will go away on its own - the rest of her testing was normal - we are still in the process of getting her in to see a urologist for the blood in her urine (referral entered on 1/16)  Thanks! Latrelle DodrillBrittany J McIntyre, MD

## 2013-10-31 ENCOUNTER — Ambulatory Visit: Payer: Self-pay

## 2013-11-02 NOTE — Telephone Encounter (Signed)
Pt is aware about medication, pt will like to know what she can do about the cyst.  Also pt can not have any referral because she has letter from the hospital some financial aid program  from [employer], pt can't have OC until hospital letter exp in June. If pt need to see Urology pt needs to pay out of pocket. (pt is aware).  Marines

## 2013-11-02 NOTE — Telephone Encounter (Signed)
Pt does not need to do anything specifically about the cyst. It should resolve on its own. If she has worsening pain or other complaints she should return to be seen in clinic. Please encourage her to see a urologist for the blood that has been in her urine, even if she has to self pay.  Can you call her and tell her this? Thanks Marines!  Latrelle DodrillBrittany J McIntyre, MD

## 2013-11-08 NOTE — Telephone Encounter (Signed)
Marines, are you able to call this pt back to answer her question? Thanks :) [place]ittany

## 2013-11-12 NOTE — Telephone Encounter (Signed)
Pt is aware, Britta MccreedyBarbara is working in pt OC looks like she has a change to get it.  Pt needs to bring some documentation to obtain OC.  Marines

## 2013-11-19 ENCOUNTER — Ambulatory Visit: Payer: Self-pay

## 2013-12-09 ENCOUNTER — Emergency Department (HOSPITAL_COMMUNITY): Payer: No Typology Code available for payment source

## 2013-12-09 ENCOUNTER — Encounter (HOSPITAL_COMMUNITY): Payer: Self-pay | Admitting: Emergency Medicine

## 2013-12-09 ENCOUNTER — Emergency Department (HOSPITAL_COMMUNITY)
Admission: EM | Admit: 2013-12-09 | Discharge: 2013-12-09 | Disposition: A | Discharge status: Home / Self Care | Payer: No Typology Code available for payment source | Attending: Emergency Medicine | Admitting: Emergency Medicine

## 2013-12-09 ENCOUNTER — Other Ambulatory Visit: Payer: Self-pay

## 2013-12-09 DIAGNOSIS — M549 Dorsalgia, unspecified: Secondary | ICD-10-CM

## 2013-12-09 DIAGNOSIS — M79609 Pain in unspecified limb: Secondary | ICD-10-CM | POA: Insufficient documentation

## 2013-12-09 DIAGNOSIS — R209 Unspecified disturbances of skin sensation: Secondary | ICD-10-CM | POA: Insufficient documentation

## 2013-12-09 DIAGNOSIS — Z79899 Other long term (current) drug therapy: Secondary | ICD-10-CM | POA: Insufficient documentation

## 2013-12-09 DIAGNOSIS — Z87828 Personal history of other (healed) physical injury and trauma: Secondary | ICD-10-CM | POA: Insufficient documentation

## 2013-12-09 DIAGNOSIS — R61 Generalized hyperhidrosis: Secondary | ICD-10-CM | POA: Insufficient documentation

## 2013-12-09 DIAGNOSIS — M546 Pain in thoracic spine: Secondary | ICD-10-CM | POA: Insufficient documentation

## 2013-12-09 MED ORDER — CYCLOBENZAPRINE HCL 10 MG PO TABS
5.0000 mg | ORAL_TABLET | Freq: Once | ORAL | Status: AC
  Administered 2013-12-09: 5 mg via ORAL
  Filled 2013-12-09: qty 1

## 2013-12-09 MED ORDER — GI COCKTAIL ~~LOC~~
30.0000 mL | Freq: Once | ORAL | Status: DC
  Filled 2013-12-09: qty 30

## 2013-12-09 MED ORDER — IBUPROFEN 400 MG PO TABS
600.0000 mg | ORAL_TABLET | Freq: Once | ORAL | Status: AC
  Administered 2013-12-09: 600 mg via ORAL
  Filled 2013-12-09 (×2): qty 1

## 2013-12-09 NOTE — ED Notes (Signed)
Patient returned from X-ray 

## 2013-12-09 NOTE — ED Notes (Signed)
Per interpretor phone:  Pt reports that she had work related accident in June and since has had some tingling in arms.  Pt reports headache, upper back pain and tingling down arms, denies incontinence.  Ekg done at triage

## 2013-12-09 NOTE — ED Provider Notes (Signed)
CSN: [id number]     Arrival date & time 12/09/13  1505 History   First MD Initiated Contact with Patient 12/09/13 1618     Chief Complaint  Patient presents with  . Hand Pain  . Back Pain  . Foot Pain     HPI  38 yo F spanish speaking only who presents with paresthesias to her LU and LLE since April 02 2013. She reports having had accident at work. She recalls falling onto her left side while carrying a basket full of towels while at work at the [employer]. She was seen about a week after the fall and had XR. Per patient these films were normal. Since then she has continued to have paresthesias to her L arm and leg.  It is not getting any worse or better. She denies any urinary or bowel incontinence other then the previously noted stress incontinence.  She denies any fevers, falls, or recent trauma other than the fall in June of last year. She is seen by Dr. Pollie MeyerMcintyre but she has not addressed these chronic problems with her yet.   History reviewed. No pertinent past medical history. Past Surgical History  Procedure Laterality Date  . Cesarean section     No family history on file. History  Substance Use Topics  . Smoking status: Never Smoker   . Smokeless tobacco: Not on file  . Alcohol Use: No   OB History   Grav Para Term Preterm Abortions TAB SAB Ect Mult Living   8 6   2  2   6      Review of Systems  Constitutional: Negative for fever, chills, activity change and appetite change.  HENT: Negative for congestion, ear pain and rhinorrhea.   Eyes: Negative for pain.  Respiratory: Negative for cough and shortness of breath.   Cardiovascular: Negative for chest pain and palpitations.  Gastrointestinal: Negative for nausea, vomiting and abdominal pain.  Genitourinary: Negative for dysuria, difficulty urinating and pelvic pain.  Musculoskeletal: Positive for back pain. Negative for neck pain.  Skin: Negative for rash and wound.  Neurological: Negative for weakness and  headaches.       Tingling in arm and leg   Psychiatric/Behavioral: Negative for behavioral problems, confusion and agitation.      Allergies  Review of patient's allergies indicates no known allergies.  Home Medications   Current Outpatient Rx  Name  Route  Sig  Dispense  Refill  . ibuprofen (ADVIL,MOTRIN) 200 MG tablet   Oral   Take 400 mg by mouth daily as needed for mild pain.         Marland Kitchen. oxybutynin (DITROPAN) 5 MG tablet   Oral   Take 5 mg by mouth 2 (two) times daily.         Marland Kitchen. tolterodine (DETROL LA) 2 MG 24 hr capsule   Oral   Take 2 mg by mouth daily.          BP 117/86  Pulse 72  Temp(Src) 98 F (36.7 C) (Oral)  Resp 18  SpO2 98%  LMP 11/28/2013 Physical Exam  Constitutional: She is oriented to person, place, and time. She appears well-developed and well-nourished. No distress.  HENT:  Head: Normocephalic and atraumatic.  Nose: Nose normal.  Mouth/Throat: Oropharynx is clear and moist.  Eyes: EOM are normal. Pupils are equal, round, and reactive to light.  Neck: Normal range of motion. Neck supple. No tracheal deviation present.  Cardiovascular: Normal rate, regular rhythm, normal  heart sounds and intact distal pulses.   Pulmonary/Chest: Effort normal and breath sounds normal. She has no rales.  Abdominal: Soft. Bowel sounds are normal. She exhibits no distension. There is no tenderness. There is no rebound and no guarding.  Musculoskeletal: Normal range of motion. She exhibits no tenderness.  Neurological: She is alert and oriented to person, place, and time. She has normal strength. No cranial nerve deficit or sensory deficit. She displays a negative Romberg sign. Gait normal. GCS eye subscore is 4. GCS verbal subscore is 5. GCS motor subscore is 6.  Reflex Scores:      Patellar reflexes are 2+ on the right side and 2+ on the left side. Symmetric strength of upper and lower extremities. Patient able to ambulate. She reports subjective tingling to her L  hand and foot. She reports TTP to her trapezius muscles blt. Mild mid line ttp to upper T spine.   Skin: Skin is warm and dry. No rash noted.  Psychiatric: She has a normal mood and affect. Her behavior is normal.    ED Course  Procedures   Imaging Review Dg Cervical Spine Complete  12/09/2013   CLINICAL DATA:  Neck pain.  EXAM: CERVICAL SPINE  4+ VIEWS  COMPARISON:  None.  FINDINGS: There is no evidence of cervical spine fracture or prevertebral soft tissue swelling. Alignment is normal. No other significant bone abnormalities are identified.  IMPRESSION: Negative cervical spine radiographs.   Electronically Signed   By: Roque Lias M.D.   On: 12/09/2013 17:17   Dg Thoracic Spine 2 View  12/09/2013   CLINICAL DATA:  Back pain.  EXAM: THORACIC SPINE - 2 VIEW  COMPARISON:  None.  FINDINGS: There is no evidence of fracture. There is no listhesis. There may be minimal right convex curvature of the mid thoracic spine. Disc space heights appear preserved. Visualized portions of the lungs are clear.  IMPRESSION: No evidence of acute osseous abnormality in the thoracic spine.   Electronically Signed   By: Sebastian Ache   On: 12/09/2013 17:16      MDM   Final diagnoses:  Back pain    38 yo F in NAD AFVSS who presents with paresthesias to LU and LLE. Neuro exam non focal. Normal finger to nose, heel to shin, Romberg, Normal HINTS, Reflexes intact and symmetric, Strength symmetric. Mild TTP to upper T spine and trapezius muscle. Will obtain plain films of C spine and T spine.   5:53 PM XR with no acute fracture. Flexeril and motrin given. Case co managed with my attending Dr. Elesa Massed. Patient must follow up with her PCP to manage this chronic issue. Strong return precautions given     Nadara Mustard, MD 12/09/13 1754

## 2013-12-10 NOTE — ED Provider Notes (Signed)
I saw and evaluated the patient, reviewed the resident's note and I agree with the findings and plan.   EKG Interpretation None      Pt is a 38 y.o. F with no significant past medical history who presents to the emergency department with neck and back pain that started after she had a fall in June 2014. She has had painful tingling in her left upper and lower extremity since. No weakness. No incontinence. No fever. No new injury. On exam, patient is completely neurologically intact. She has normal reflexes in her bilateral upper and lower extremities, 5/5 strength in all 4 extremity, sensation to light touch intact diffusely, cranial nerves 2-12 intact, no clonus. She also has normal gait.  She has mild tenderness to palpation over her cervical and thoracic spine diffusely. Imaging is unremarkable. Have discussed with patient that I do not feel she needs emergent MRI at this time. I am not concerned for stroke or any other intracranial pathology. I am not concerned for epidural abscess, transverse myelitis, discitis, spinal stenosis. Her symptoms are very atypical but may be consistent with radiculopathy. Have given strict return precautions. Patient verbalizes understanding and is comfortable plan.  Layla MawKristen N Dempsy Damiano, DO 12/10/13 2355

## 2013-12-14 ENCOUNTER — Ambulatory Visit (INDEPENDENT_AMBULATORY_CARE_PROVIDER_SITE_OTHER): Payer: No Typology Code available for payment source | Admitting: Family Medicine

## 2013-12-14 ENCOUNTER — Encounter: Payer: Self-pay | Admitting: Family Medicine

## 2013-12-14 VITALS — BP 120/78 | HR 78 | Ht 60.0 in | Wt 147.6 lb

## 2013-12-14 DIAGNOSIS — R3129 Other microscopic hematuria: Secondary | ICD-10-CM

## 2013-12-14 DIAGNOSIS — M549 Dorsalgia, unspecified: Secondary | ICD-10-CM | POA: Insufficient documentation

## 2013-12-14 DIAGNOSIS — M546 Pain in thoracic spine: Secondary | ICD-10-CM

## 2013-12-14 MED ORDER — CYCLOBENZAPRINE HCL 10 MG PO TABS: 10.0000 mg | ORAL_TABLET | Freq: Three times a day (TID) | ORAL | Status: DC | PRN

## 2013-12-14 NOTE — Patient Instructions (Addendum)
Return to see Dr. Pollie MeyerMcIntyre in 2-4 weeks to follow up on your numbness/weakness.   Start muscle relaxant. Continue daily Ibuprofen to help with pain. Will consider CT/MRI imaging once you have the orange card. Apply heat to affected areas.

## 2013-12-14 NOTE — Assessment & Plan Note (Signed)
Patient has been unable to be referred to urology as she is still awaiting approval of orange card. Patient states that she is currently working on the application process.

## 2013-12-14 NOTE — Progress Notes (Signed)
   Subjective:    Patient ID: Kristine Hayden, female    DOB: 1976-05-20, 38 y.o.   MRN: [id number]  HPI 38 year old female presents for emergency room followup. Patient was seen and evaluated on 12/09/2013. Patient is a Hispanic female and is accompanied by a [place] interpreter.  Patient reports two-year history of thoracic back pain which is associated with pain and paresthesias of her left upper and lower extremity, patient states that her symptoms began 2 years ago which she sustained a fall at work [employer] patient states that she was carrying laundry up a set of stairs, she fell down the stairs on her left side, she fell about a total of 4 stairs, she denies loss of consciousness, she states at the time she had some low back pain without evidence of radiculopathy, patient states that over the past year she has had worsening pain which is primarily in her thoracic back area (left greater than right), she reports a pulling type sensation in her left upper extremity and left lower extremity, she has associated headaches as a pulling sensation extends up into her not and had, she states that she has had some recent weakness of the left lower extremity due to her symptoms, her symptoms have recently spread to the right upper and right lower extremity however are not as severe, the patient reports that she did report this incident to her work however nothing was ever followed through, patient takes ibuprofen 600 mg every 6 hours which provided minimal relief of her pain, she has not attempted any heat, ice, or massage to the area. She denies bladder or bowel incontinence.   Review of Systems  Constitutional: Negative for fever, chills and fatigue.  Respiratory: Negative for cough and shortness of breath.   Cardiovascular: Negative for chest pain and leg swelling.  Gastrointestinal: Negative for nausea, vomiting and diarrhea.  Musculoskeletal: Positive for back pain, myalgias, neck pain  and neck stiffness.  Neurological: Positive for numbness and headaches.       Objective:   Physical Exam Vitals: Reviewed General: Pleasant Hispanic female, no acute distress HEENT: Normocephalic, pupils are equal round and reactive to light, extraocular motion intact, no scleral icterus, nasal septum midline, no rhinorrhea, moist mucous members, no pharyngeal erythema or exudate noted, neck was supple, no anterior or posterior cervical lymphadenopathy Cardiac: Regular rate and rhythm, S1-S2 present, no murmurs, no heaves or thrills Respiratory: Clear to auscultation bilaterally, normal effort MSK: Patient has diffuse myalgias especially over the thoracic paraspinal muscles and cervical paraspinal muscles, she has mild midline tenderness in the thoracic and cervical area as well Skin: No rash MSK: Cranial nerves II through XII intact, strength was 5 of 5 in all extremities (patient had poor effort however was able to maintain strength for short periods of time), sensation to light touch was grossly intact, Romberg negative, pronator drift negative, gait was unremarkable except for mildly of the left lower extremity  Reviewed x-rays performed of the cervical and thoracic spine on 12/09/2013. Imaging at that time was unremarkable for acute process.     Assessment & Plan:  Please see problem specific assessment and plan.  Of note patient is currently applying for the orange card as she currently is uninsured.

## 2013-12-14 NOTE — Assessment & Plan Note (Signed)
Patient presents for evaluation of multiple myalgias primarily of the thoracic and cervical paraspinal muscles with associated paresthesias of the left upper and lower extremities. The exact etiology of her symptoms is unclear. Neurologic examination was unremarkable and nonfocal. X-ray imaging performed on 12/09/2013 of her cervical and thoracic spine was unremarkable. Patient is currently pending approval of orange card and is unable to afford imaging at this time. -Patient provided with a short course of Flexeril to help with muscle spasm which is to be contributing to her symptoms, she was also counseled on daily use of heat and massage as well as daily use of ibuprofen. -Patient to return to see her PCP in 2 weeks for follow up, if symptoms persist would consider basic lab work including CBC and BMP as well as consider additional imaging (could consider MRI head, cervical, and thoracic spine however likely low yield given nature of initial injury and non-focal neurologic exam).

## 2014-01-14 ENCOUNTER — Other Ambulatory Visit (HOSPITAL_COMMUNITY): Payer: Self-pay | Admitting: Sports Medicine

## 2014-01-14 DIAGNOSIS — M542 Cervicalgia: Secondary | ICD-10-CM

## 2014-01-14 DIAGNOSIS — M545 Low back pain, unspecified: Secondary | ICD-10-CM

## 2014-01-16 ENCOUNTER — Ambulatory Visit (INDEPENDENT_AMBULATORY_CARE_PROVIDER_SITE_OTHER): Payer: No Typology Code available for payment source | Admitting: Family Medicine

## 2014-01-16 VITALS — BP 123/74 | HR 71 | Temp 98.7°F | Ht 60.0 in | Wt 147.0 lb

## 2014-01-16 DIAGNOSIS — M546 Pain in thoracic spine: Secondary | ICD-10-CM

## 2014-01-16 DIAGNOSIS — M549 Dorsalgia, unspecified: Secondary | ICD-10-CM

## 2014-01-16 MED ORDER — TRAMADOL HCL 50 MG PO TABS: 50.0000 mg | ORAL_TABLET | Freq: Three times a day (TID) | ORAL | Status: DC | PRN

## 2014-01-16 NOTE — Patient Instructions (Signed)
It was great to see you again today!  I am referring you to physical therapy and sports medicine for your back pain. You will get a phone call to schedule this appointment.  Use caution with the tramadol because it might make you sleepy.  Be well, Dr. Pollie MeyerMcIntyre

## 2014-01-20 NOTE — Assessment & Plan Note (Signed)
Persistent pain despite using flexeril & ibuprofen. Plan -rx tramadol for pain relief -start formal physical therapy -refer to sports medicine for additional evaluation (specifically will defer to SM whether to obtain imaging, given sx's both in cervical spine and thoracic spine)  Precepted with Dr. Lum BabeEniola who agrees with this plan.

## 2014-01-20 NOTE — Progress Notes (Signed)
Patient ID: Kristine Hayden, female   DOB: 14-May-1976, 38 y.o.   MRN: [id number]  Spanish phone interpreter utilized during this encounter.  HPI:  Back pain: was previously seen here by Dr. Randolm IdolFletke for back pain. Started 2 years ago when she fell. Has pain in both her cervical spine and the middle of her thoracic spine. Notices a cracking noise in her neck. Cervical spine pain radiates to her L arm and down to her fingers. Especially notices this with sleeping. Also has begun to affect her R arm. All fingers in her L hand are affected. Also notes tingling/numbness in her left leg/foot. Denies any fever, bowel/bladder dysfunction, or saddle anesthesia. She does notice more numbness if she strains hard ot have a bowel movement. Thinks she may have recently gotten the orange card. Dr. Randolm IdolFletke gave her an rx for flexeril which helped just briefly but then the pain returned. She has taken ibuprofen 800mg  TID but this has not helped. Has not tried ice or heat. Has had xrays but no MRI's. She does not work. She is right handed.  ROS: See HPI  PMFSH: hx microscopic hematuria, awaiting referral to urology due to not having payer source  PHYSICAL EXAM: BP 123/74  Pulse 71  Temp(Src) 98.7 F (37.1 C) (Oral)  Ht 5' (1.524 m)  Wt 147 lb (66.679 kg)  BMI 28.71 kg/m2 Gen: NAD HEENT: NCAT Heart: RRR Lungs: CTAB, NWOB Neuro: full strength in bilateral lower extremities and upper extremities. 2+ patellar reflexes bilaterally. Full ROM of neck. Pain with most movements of L arm/shoulder.  ASSESSMENT/PLAN:  See problem based charting for additional assessment/plan.   FOLLOW UP: F/u with sports medicine (referring there today)  [place]Brittany J. Pollie MeyerMcIntyre, MD [facility]

## 2014-01-30 ENCOUNTER — Ambulatory Visit: Payer: No Typology Code available for payment source | Attending: Family Medicine | Admitting: Physical Therapy

## 2014-01-30 ENCOUNTER — Ambulatory Visit (HOSPITAL_COMMUNITY)
Admission: RE | Admit: 2014-01-30 | Discharge: 2014-01-30 | Disposition: A | Discharge status: Home / Self Care | Payer: No Typology Code available for payment source | Source: Ambulatory Visit | Attending: Sports Medicine | Admitting: Sports Medicine

## 2014-01-30 DIAGNOSIS — M25559 Pain in unspecified hip: Secondary | ICD-10-CM | POA: Insufficient documentation

## 2014-01-30 DIAGNOSIS — M545 Low back pain, unspecified: Secondary | ICD-10-CM | POA: Insufficient documentation

## 2014-01-30 DIAGNOSIS — M542 Cervicalgia: Secondary | ICD-10-CM | POA: Insufficient documentation

## 2014-01-30 DIAGNOSIS — M79609 Pain in unspecified limb: Secondary | ICD-10-CM | POA: Insufficient documentation

## 2014-01-30 DIAGNOSIS — IMO0001 Reserved for inherently not codable concepts without codable children: Secondary | ICD-10-CM | POA: Insufficient documentation

## 2014-01-30 DIAGNOSIS — R51 Headache: Secondary | ICD-10-CM | POA: Insufficient documentation

## 2014-01-31 ENCOUNTER — Ambulatory Visit: Payer: No Typology Code available for payment source | Admitting: Physical Therapy

## 2014-02-05 ENCOUNTER — Ambulatory Visit (INDEPENDENT_AMBULATORY_CARE_PROVIDER_SITE_OTHER): Payer: No Typology Code available for payment source | Admitting: Family Medicine

## 2014-02-05 ENCOUNTER — Encounter: Payer: No Typology Code available for payment source | Admitting: Rehabilitation

## 2014-02-05 ENCOUNTER — Encounter: Payer: Self-pay | Admitting: Family Medicine

## 2014-02-05 VITALS — BP 120/79 | Ht 60.0 in | Wt 147.0 lb

## 2014-02-05 DIAGNOSIS — M542 Cervicalgia: Secondary | ICD-10-CM

## 2014-02-05 DIAGNOSIS — G8929 Other chronic pain: Secondary | ICD-10-CM

## 2014-02-05 DIAGNOSIS — M79609 Pain in unspecified limb: Secondary | ICD-10-CM

## 2014-02-05 DIAGNOSIS — M79602 Pain in left arm: Secondary | ICD-10-CM

## 2014-02-05 DIAGNOSIS — M546 Pain in thoracic spine: Secondary | ICD-10-CM

## 2014-02-05 MED ORDER — METHOCARBAMOL 750 MG PO TABS: 750.0000 mg | ORAL_TABLET | Freq: Three times a day (TID) | ORAL | Status: DC

## 2014-02-05 MED ORDER — DICLOFENAC SODIUM 75 MG PO TBEC: 75.0000 mg | DELAYED_RELEASE_TABLET | Freq: Two times a day (BID) | ORAL | Status: DC

## 2014-02-05 NOTE — Patient Instructions (Addendum)
Thank you for coming in today  1. Start diclofenac 2x per day 2. Start robaxin 3x per day 3. Stop naproxen, ibuprofen, and flexeril 4. Refer to physical therapy 5. Followup with primary doctor in about 6 weeks 6. Order EMG and nerve conduction study.

## 2014-02-06 ENCOUNTER — Ambulatory Visit: Payer: No Typology Code available for payment source | Admitting: Rehabilitation

## 2014-02-06 NOTE — Progress Notes (Signed)
CC: Back pain HPI: Patient is a 38 year old Spanish-speaking female who presents today for evaluation of back pain. History is provided by her interpreter. Patient complains of back pain that started after she had a fall 2 years ago. She states that she has neck pain, upper back pain, and pain that radiates all the way down her back to her lumbar spine. She also complains that there is a pulling sensation and cracking. She gets pain and numbness that radiates down her left arm in the C6 distribution. She notes that neck extension hurts. The pain does radiate at times into both arms and both legs.  ROS: As above in the HPI. All other systems are stable or negative.  OBJECTIVE: APPEARANCE:  Patient in no acute distress.The patient appeared well nourished and normally developed. HEENT: No scleral icterus. Conjunctiva non-injected Resp: Non labored Skin: No rash MSK:  Neck: Equivocal spurling's Full neck range of motion Grip strength and sensation normal in bilateral hands Strength good C4 to T1 distribution No sensory change to C4 to T1 Reflexes normal   Low back exam: - Full range of motion in flexion, extension, lateral bending, rotation without pain  - Diffuse tenderness to palpation over the spinous processes of the lumbar vertebra - Tenderness to palpation in the lumbar musculature  - Negative straight leg raise - Strength 5 out of 5 in the bilateral lower extremities - Reflexes 2+ bilaterally.  Of note, when I tested the patient's strength in her extremities everything that I did to her seem to hurt. When I did resisted wrist extension that her her wrists. She complained of difficulty in testing her grip because her hands hurt. It did not seem that there was a single test that did not cause pain in that area of the body.  MSK US: Not performed   ASSESSMENT: #1. Axial back and neck pain #2. Diffuse body aches and pains of unclear etiology  PLAN: I did personally review the  patient's MRI scans today. These are completely normal. There is no sign of nerve impingement to explain her symptoms. I do feel that her symptoms are most consistent with axial back pain. Patient was not very happy with this explanation. I did recommend that she continue use of NSAID. I emphasized the physical therapy is the most important thing. We will get a NCV/EMG of her left arm.  Patient did want an explanation of all the other aches and pains that seem to radiate into her arms and legs. I did it tried to explain to her that I unfortunately cannot give her an explanation for this. I did recommend that she followup with her PCP for further discussion of this. She may require blood work to rule out rheumatologic conditions.

## 2014-02-07 ENCOUNTER — Ambulatory Visit: Payer: No Typology Code available for payment source | Admitting: Physical Therapy

## 2014-02-12 ENCOUNTER — Ambulatory Visit: Payer: No Typology Code available for payment source | Attending: Family Medicine | Admitting: Rehabilitation

## 2014-02-12 DIAGNOSIS — M25559 Pain in unspecified hip: Secondary | ICD-10-CM | POA: Insufficient documentation

## 2014-02-12 DIAGNOSIS — IMO0001 Reserved for inherently not codable concepts without codable children: Secondary | ICD-10-CM | POA: Insufficient documentation

## 2014-02-14 ENCOUNTER — Ambulatory Visit: Payer: No Typology Code available for payment source | Admitting: Physical Therapy

## 2014-02-19 ENCOUNTER — Ambulatory Visit: Payer: No Typology Code available for payment source | Admitting: Rehabilitation

## 2014-02-21 ENCOUNTER — Ambulatory Visit: Payer: No Typology Code available for payment source | Admitting: Rehabilitation

## 2014-02-27 ENCOUNTER — Ambulatory Visit: Payer: No Typology Code available for payment source | Admitting: Physical Therapy

## 2014-03-01 ENCOUNTER — Ambulatory Visit (INDEPENDENT_AMBULATORY_CARE_PROVIDER_SITE_OTHER): Payer: No Typology Code available for payment source | Admitting: Family Medicine

## 2014-03-01 VITALS — BP 100/68 | HR 66 | Temp 98.5°F | Resp 16 | Wt 148.0 lb

## 2014-03-01 DIAGNOSIS — M546 Pain in thoracic spine: Secondary | ICD-10-CM

## 2014-03-01 DIAGNOSIS — R209 Unspecified disturbances of skin sensation: Secondary | ICD-10-CM

## 2014-03-01 DIAGNOSIS — R202 Paresthesia of skin: Secondary | ICD-10-CM

## 2014-03-01 LAB — CBC WITH DIFFERENTIAL/PLATELET
Basophils Absolute: 0 10*3/uL (ref 0.0–0.1)
Basophils Relative: 0 % (ref 0–1)
Eosinophils Absolute: 0.2 10*3/uL (ref 0.0–0.7)
Eosinophils Relative: 2 % (ref 0–5)
HCT: 38.7 % (ref 36.0–46.0)
HEMOGLOBIN: 13.1 g/dL (ref 12.0–15.0)
Lymphocytes Relative: 27 % (ref 12–46)
Lymphs Abs: 2.1 10*3/uL (ref 0.7–4.0)
MCH: 27.3 pg (ref 26.0–34.0)
MCHC: 33.9 g/dL (ref 30.0–36.0)
MCV: 80.6 fL (ref 78.0–100.0)
MONO ABS: 0.5 10*3/uL (ref 0.1–1.0)
MONOS PCT: 7 % (ref 3–12)
Neutro Abs: 4.9 10*3/uL (ref 1.7–7.7)
Neutrophils Relative %: 64 % (ref 43–77)
Platelets: 294 10*3/uL (ref 150–400)
RBC: 4.8 MIL/uL (ref 3.87–5.11)
RDW: 13.7 % (ref 11.5–15.5)
WBC: 7.6 10*3/uL (ref 4.0–10.5)

## 2014-03-01 LAB — COMPREHENSIVE METABOLIC PANEL
ALK PHOS: 57 U/L (ref 39–117)
ALT: <8 U/L (ref 0–35)
AST: 12 U/L (ref 0–37)
Albumin: 4.2 g/dL (ref 3.5–5.2)
BUN: 11 mg/dL (ref 6–23)
CO2: 29 mEq/L (ref 19–32)
CREATININE: 0.46 mg/dL — AB (ref 0.50–1.10)
Calcium: 8.9 mg/dL (ref 8.4–10.5)
Chloride: 104 mEq/L (ref 96–112)
GLUCOSE: 91 mg/dL (ref 70–99)
Potassium: 4 mEq/L (ref 3.5–5.3)
Sodium: 139 mEq/L (ref 135–145)
Total Bilirubin: 0.3 mg/dL (ref 0.2–1.2)
Total Protein: 7 g/dL (ref 6.0–8.3)

## 2014-03-01 LAB — RHEUMATOID FACTOR

## 2014-03-01 LAB — POCT GLYCOSYLATED HEMOGLOBIN (HGB A1C): Hemoglobin A1C: 5.8

## 2014-03-01 NOTE — Patient Instructions (Signed)
It was great to see you again today!  I will call you if your test results are not normal.  Otherwise, I will send you a letter.  If you do not hear from me with in 2 weeks please call our office.      Follow up in 2-3 weeks to review your bloodwork.  I am referring you to physical therapy in [place]. You will get a phone call to schedule this appointment.   Use tylenol and a heating pad for your back.  I will look into the urology referral.  If you have any swelling of your face/tongue or trouble breathing go to the ER immediately.  Be well, Dr. Pollie Meyer

## 2014-03-02 LAB — TSH: TSH: 0.82 u[IU]/mL (ref 0.350–4.500)

## 2014-03-04 LAB — ANA: Anti Nuclear Antibody(ANA): NEGATIVE

## 2014-03-05 ENCOUNTER — Ambulatory Visit: Payer: No Typology Code available for payment source | Attending: Family Medicine | Admitting: Rehabilitation

## 2014-03-05 DIAGNOSIS — M25559 Pain in unspecified hip: Secondary | ICD-10-CM | POA: Insufficient documentation

## 2014-03-05 DIAGNOSIS — R200 Anesthesia of skin: Secondary | ICD-10-CM | POA: Insufficient documentation

## 2014-03-05 DIAGNOSIS — R202 Paresthesia of skin: Secondary | ICD-10-CM

## 2014-03-05 DIAGNOSIS — IMO0001 Reserved for inherently not codable concepts without codable children: Secondary | ICD-10-CM | POA: Insufficient documentation

## 2014-03-05 NOTE — Assessment & Plan Note (Addendum)
Facial tingling of 2-3 weeks duration. Etiology very unclear to me. Suspect some kind of psychosomatic condition. History is somewhat vague. Will check labwork: CMET, TSH, CBC, ANA, RF, A1c to rule out basic metabolic/endocrine/hematologic etiologies. F/u in 2-3 weeks to review lab results. Precepted with Dr. Lum Babe who agrees with this plan.

## 2014-03-05 NOTE — Progress Notes (Signed)
Patient ID: Kristine Hayden, female   DOB: 04-27-1976, 38 y.o.   MRN: [id number]  Spanish interpreter utilized during this visit.  HPI:  Pt presents today with multiple complaints. Due to time constraints and having to use an interpreter, we addressed the following concern:  Facial tingling: has noted tingling all over her face, especially in the lower area around her chin. States this began 2-3 weeks ago. Feels like it is swollen, but has not seen it actually become swollen, just feels that way. It also stings/itches some. No vision problems. No breathing problems, fevers, speech problems. Comes and goes during the day, about every 2-3 hours. Has also had throat pain x 2 months. Has an "anxious feeling" in her face.  Back pain: Has been seen at sports medicine. They are working on setting up a nerve conduction study since she has pain radiating down her L arm. Hasn't gotten that study yet. Doesn't like her physical therapist that she sees in [facility]. Wants to go to a different one.  ROS: See HPI. PAN POSITIVE review of systems, making differentiation of significant symptoms quite difficult.  PMFSH: hx microscopic hematuria, previously referred but hasn't yet been seen by urology.  PHYSICAL EXAM: BP 100/68  Pulse 66  Temp(Src) 98.5 F (36.9 C) (Oral)  Resp 16  Wt 148 lb (67.132 kg)  SpO2 96%  LMP 03/01/2014 Gen: NAD HEENT: NCAT, facial sensation intact to light touch bilaterally, face symmetric, EOMI, PERRL, negative chovsteks bilaterally Heart: RRR, no murmurs Lungs: CTAB, NWOB Abdomen: soft, NTTP Neuro: grossly nonfocal, speech normal, full strength in all extremities  ASSESSMENT/PLAN:  See problem based charting for additional assessment/plan.  FOLLOW UP: F/u in 2-3 weeks to review lab results  [place] J. Pollie Meyer, MD [facility]

## 2014-03-05 NOTE — Assessment & Plan Note (Signed)
Pain persists. Awaiting nerve conduction study by sports medicine. We did not fully discuss/evaluate this pain today. Will refer to physical therapy in [place] since she prefers to go there. In the meantime recommend tylenol and heating pad for back pain, and schedule nerve conduction study as recommended/ordered by sports medicine.

## 2014-03-07 ENCOUNTER — Ambulatory Visit: Payer: No Typology Code available for payment source | Admitting: Rehabilitation

## 2014-03-08 ENCOUNTER — Ambulatory Visit: Payer: No Typology Code available for payment source | Admitting: Rehabilitation

## 2014-03-11 ENCOUNTER — Ambulatory Visit: Payer: No Typology Code available for payment source | Admitting: Physical Therapy

## 2014-03-13 ENCOUNTER — Ambulatory Visit: Payer: No Typology Code available for payment source | Admitting: Rehabilitation

## 2014-03-14 ENCOUNTER — Encounter: Payer: Self-pay | Admitting: Family Medicine

## 2014-03-18 ENCOUNTER — Ambulatory Visit: Payer: No Typology Code available for payment source | Admitting: Rehabilitation

## 2014-03-20 ENCOUNTER — Ambulatory Visit: Payer: No Typology Code available for payment source | Admitting: Physical Therapy

## 2014-03-25 ENCOUNTER — Ambulatory Visit: Payer: No Typology Code available for payment source | Admitting: Rehabilitation

## 2014-03-27 ENCOUNTER — Ambulatory Visit: Payer: No Typology Code available for payment source | Admitting: Rehabilitation

## 2014-03-29 ENCOUNTER — Encounter: Payer: Self-pay | Admitting: Family Medicine

## 2014-04-03 ENCOUNTER — Ambulatory Visit (INDEPENDENT_AMBULATORY_CARE_PROVIDER_SITE_OTHER): Payer: No Typology Code available for payment source | Admitting: Family Medicine

## 2014-04-03 VITALS — BP 115/78 | HR 67 | Temp 97.9°F | Wt 144.4 lb

## 2014-04-03 DIAGNOSIS — R102 Pelvic and perineal pain: Secondary | ICD-10-CM | POA: Insufficient documentation

## 2014-04-03 DIAGNOSIS — R319 Hematuria, unspecified: Secondary | ICD-10-CM

## 2014-04-03 DIAGNOSIS — N949 Unspecified condition associated with female genital organs and menstrual cycle: Secondary | ICD-10-CM

## 2014-04-03 DIAGNOSIS — R3129 Other microscopic hematuria: Secondary | ICD-10-CM

## 2014-04-03 LAB — POCT URINALYSIS DIPSTICK
Bilirubin, UA: NEGATIVE
Glucose, UA: NEGATIVE
Ketones, UA: NEGATIVE
Leukocytes, UA: NEGATIVE
Nitrite, UA: NEGATIVE
PROTEIN UA: 30
Spec Grav, UA: >=1.03
UROBILINOGEN UA: 0.2
pH, UA: 6

## 2014-04-03 LAB — POCT UA - MICROSCOPIC ONLY

## 2014-04-03 LAB — POCT WET PREP (WET MOUNT): Clue Cells Wet Prep Whiff POC: NEGATIVE

## 2014-04-03 NOTE — Assessment & Plan Note (Signed)
Will recheck urinalysis with microscopy. If hematuria remains, will order CT scan of abdomen and pelvis with and without IV contrast per urology request.

## 2014-04-03 NOTE — Assessment & Plan Note (Addendum)
Check wet prep given reported vaginal burning and discharge. Pelvic exam benign, except for mild tenderness with cervical manipulation. She's previously complained of this (this is documented in an exam from January 2015) and had an unremarkable pelvic ultrasound and negative STD testing at that time. At this time I think it is more pressing that we evaluate her microscopic hematuria. See separate problem for additional details.

## 2014-04-03 NOTE — Patient Instructions (Signed)
It was great to see you again today!  We are checking your urine for blood. If there is still blood present you will need to get a CT scan. We will get you set up for this if it is the case.  Follow up with me in a few weeks to talk about your back.  Be well, Dr. Pollie MeyerMcIntyre

## 2014-04-03 NOTE — Progress Notes (Signed)
Patient ID: Kristine Hayden, female   DOB: January 03, 1976, 38 y.o.   MRN: [id number]  Spanish interpreter utilized for this visit.  HPI:  Note: Patient had this visit scheduled originally to discuss her back pain, however today we elected to instead discuss her hematuria in order to expedite her urology evaluation.  Hematuria: Patient still think she has some blood in her urine. She denies any fevers. She's had some lower pelvic discomfort, which she describes as numbness/pain. Has some vaginal discharge that is white and yellow. She has also noticed that she's had some bumps for the last month around her vagina. She has no new sexual partners. We previously referred her to urology, but I recently received a message from Dr. Jeralyn RuthsWren's office saying that she needed a CT of her abdomen prior to him seeing her.  ROS: See HPI  PMFSH: history microscopic hematuria, chronic left-sided thoracic back pain.  PHYSICAL EXAM: BP 115/78  Pulse 67  Temp(Src) 97.9 F (36.6 C) (Oral)  Wt 144 lb 6.4 oz (65.499 kg) Gen: NAD, pleasant, cooperative HEENT: NCAT Lungs: NWOB Neuro: grossly nonfocal, speech intact GU: normal appearing external genitalia without lesions. Vagina is moist without significant discharge. The bumps pt references are areas of mildly hypertrophied vaginal glandular tissue without surrounding erythema or color change. No lesions concerning for infection or malignancy. She again has mild tenderness with manipulation of the cervix (noted on prior exam in January). No uterine pain or adnexal pain or masses.  ASSESSMENT/PLAN:  See problem based charting for additional assessment/plan.  FOLLOW UP: F/u in a few weeks for back pain.  GrenadaBrittany J. Pollie MeyerMcIntyre, MD [facility]

## 2014-04-09 ENCOUNTER — Ambulatory Visit: Payer: No Typology Code available for payment source | Admitting: Rehabilitation

## 2014-04-22 ENCOUNTER — Ambulatory Visit: Payer: No Typology Code available for payment source | Attending: Family Medicine | Admitting: Physical Therapy

## 2014-04-22 DIAGNOSIS — IMO0001 Reserved for inherently not codable concepts without codable children: Secondary | ICD-10-CM | POA: Insufficient documentation

## 2014-04-22 DIAGNOSIS — M25559 Pain in unspecified hip: Secondary | ICD-10-CM | POA: Insufficient documentation

## 2014-04-24 ENCOUNTER — Telehealth: Payer: Self-pay | Admitting: Family Medicine

## 2014-04-24 ENCOUNTER — Ambulatory Visit (INDEPENDENT_AMBULATORY_CARE_PROVIDER_SITE_OTHER): Payer: No Typology Code available for payment source | Admitting: Family Medicine

## 2014-04-24 ENCOUNTER — Encounter: Payer: Self-pay | Admitting: Family Medicine

## 2014-04-24 ENCOUNTER — Ambulatory Visit: Payer: No Typology Code available for payment source | Admitting: Rehabilitation

## 2014-04-24 VITALS — BP 105/67 | HR 66 | Temp 98.4°F | Wt 145.6 lb

## 2014-04-24 DIAGNOSIS — R21 Rash and other nonspecific skin eruption: Secondary | ICD-10-CM

## 2014-04-24 DIAGNOSIS — R3129 Other microscopic hematuria: Secondary | ICD-10-CM

## 2014-04-24 DIAGNOSIS — M546 Pain in thoracic spine: Secondary | ICD-10-CM

## 2014-04-24 MED ORDER — PREDNISONE 20 MG PO TABS: 20.0000 mg | ORAL_TABLET | Freq: Every day | ORAL | Status: DC

## 2014-04-24 MED ORDER — HYDROCORTISONE 2.5 % EX CREA: TOPICAL_CREAM | Freq: Two times a day (BID) | CUTANEOUS | Status: DC

## 2014-04-24 MED ORDER — TRAMADOL HCL 50 MG PO TABS: 50.0000 mg | ORAL_TABLET | Freq: Three times a day (TID) | ORAL | Status: DC | PRN

## 2014-04-24 NOTE — Patient Instructions (Signed)
We will call you to get set up for CT scan Also to get set up for follow up visit at sports medicine I refilled tramadol  For rash: -take prednisone daily for 7 days -use hydrocortisone cream twice daily as needed   Follow up if rash not better in next several days Come see me in 1 month.  Be well, Dr. Pollie MeyerMcIntyre

## 2014-04-24 NOTE — Telephone Encounter (Signed)
[facility] red team please call pt and let her know the following (note pt will need Spanish interpretation).  1. I need for her to come in and do a urine pregnancy test to prove she is not pregnant prior to getting the CT scan of her abdomen. I apologize that she will need to return to have this done and that I did not think of this today when she was at her appointment. Once she has a negative test, I will order the CT scan for her.  2. Also, pt needs to be set up with sports medicine appointment. Has been seen there previously. Can we schedule this for her and let her know when the appt is?  Thanks! Latrelle DodrillBrittany J Jaliyah Fotheringham, MD

## 2014-04-24 NOTE — Assessment & Plan Note (Signed)
No red flags (pt reports subjectively feeling warm, but no documented fevers, afebrile here today). Has not yet gotten NCV/EMG as planned by sports medicine. Advised her that we will set up an appt for her to return to sports medicine in order to facilitate this study. We will call her with the appt. Refilled tramadol today.

## 2014-04-24 NOTE — Assessment & Plan Note (Signed)
Hematuria still present on UA from 7/1, so will order CT abd/pelvis with and without IV contrast per urology request so that she can be seen there eventually. We will call her with this appt.

## 2014-04-24 NOTE — Progress Notes (Signed)
Patient ID: Kristine Hayden, female   DOB: 11/14/1975, 38 y.o.   MRN: [id number]  Spanish interpreter utilized during this visit.  HPI:  Rash: has had itchy rash x 2 weeks. Started when she was in a pumpkin patch and touched some leaves. It is on her arms, legs, and neck. No ulcers in mouth, no objectively documented fevers, no new foods or medicine. Tried benadryl cream which helped a little but the rash persisted. It is not on her stomach or back.  Back pain: continues to have pain in thoracic area of back. Felt hot but didn't check her temperature so no objective fevers. Legs feel heavy and swollen but not weak. Continues to have tingling down her L arm. Was seen at sports medicine and they had planned to do a NCV/EMG of her L arm.   Hematuria: needs CT abd/pelvis with and without contrast in order to be seen by urology for her microscopic hematuria. Frequently urinates and has pelvic discomfort when urinating which is chronic in nature. Advised we would schedule her for this today.  ROS: See HPI  PMFSH: history microscopic hematuria, chronic left-sided thoracic back pain.  PHYSICAL EXAM: BP 105/67  Pulse 66  Temp(Src) 98.4 F (36.9 C)  Wt 145 lb 9.6 oz (66.044 kg)  LMP 03/27/2014 Gen: NAD HEENT: NCAT Heart: RRR Lungs: CTAB Neuro: grossly nonfocal, speech fluent Ext: full strength in bilat upper and lower extremities, no patellar hyperreflexia bilaterally Back: no deformities noted, full ROM with spinal flexion Skin: papular patchy rash over arms and legs bilaterally, also over neck and upper chest. The rash blanches. No skin breakdown or signs of superinfection. Frequently scratches.  ASSESSMENT/PLAN:  See problem based charting for additional assessment/plan.  FOLLOW UP: F/u in 1 month for general medical problems and to ensure has gotten CT scan & sports medicine referral.  Estevan RyderBrittany J. Pollie MeyerMcIntyre, MD [facility]

## 2014-04-24 NOTE — Assessment & Plan Note (Signed)
Appears to be allergic in nature, allergen is unclear though, possibly leaves from pumpkin patch. No respiratory compromise. Will rx hydrocortisone topical cream for BID use, also prednisone 20mg  daily x 7 days. F/u if not improving or worsening.

## 2014-04-30 ENCOUNTER — Ambulatory Visit: Payer: No Typology Code available for payment source | Admitting: Physical Therapy

## 2014-04-30 NOTE — Telephone Encounter (Signed)
Hale Droneamon Meza CMA, called patient and left voicemail for patient to return call to clinic.

## 2014-04-30 NOTE — Telephone Encounter (Signed)
Red team - please call this patient. Thanks! Latrelle DodrillBrittany J McIntyre, MD

## 2014-05-09 ENCOUNTER — Encounter: Payer: Self-pay | Admitting: *Deleted

## 2014-05-09 NOTE — Progress Notes (Unsigned)
Received a fax from [facility] stating that they unsuccessfully reached patient regarding her appt.  Also they are still waiting on additional testing from PCP.   These results can be faxed to [phone].

## 2014-05-24 ENCOUNTER — Ambulatory Visit: Payer: Self-pay | Admitting: Family Medicine

## 2014-06-06 ENCOUNTER — Ambulatory Visit: Payer: Self-pay | Admitting: Family Medicine

## 2014-06-24 ENCOUNTER — Ambulatory Visit (INDEPENDENT_AMBULATORY_CARE_PROVIDER_SITE_OTHER): Payer: Self-pay | Admitting: Family Medicine

## 2014-06-24 ENCOUNTER — Encounter: Payer: Self-pay | Admitting: Family Medicine

## 2014-06-24 VITALS — BP 106/69 | HR 53 | Temp 97.7°F | Ht 60.0 in | Wt 136.9 lb

## 2014-06-24 DIAGNOSIS — N949 Unspecified condition associated with female genital organs and menstrual cycle: Secondary | ICD-10-CM

## 2014-06-24 DIAGNOSIS — R21 Rash and other nonspecific skin eruption: Secondary | ICD-10-CM

## 2014-06-24 DIAGNOSIS — R102 Pelvic and perineal pain: Secondary | ICD-10-CM

## 2014-06-24 DIAGNOSIS — R3129 Other microscopic hematuria: Secondary | ICD-10-CM

## 2014-06-24 DIAGNOSIS — R109 Unspecified abdominal pain: Secondary | ICD-10-CM

## 2014-06-24 DIAGNOSIS — L299 Pruritus, unspecified: Secondary | ICD-10-CM

## 2014-06-24 DIAGNOSIS — N390 Urinary tract infection, site not specified: Secondary | ICD-10-CM

## 2014-06-24 DIAGNOSIS — R319 Hematuria, unspecified: Secondary | ICD-10-CM

## 2014-06-24 LAB — CBC WITH DIFFERENTIAL/PLATELET
BASOS PCT: 0 % (ref 0–1)
Basophils Absolute: 0 10*3/uL (ref 0.0–0.1)
EOS ABS: 0.1 10*3/uL (ref 0.0–0.7)
EOS PCT: 2 % (ref 0–5)
HEMATOCRIT: 39.2 % (ref 36.0–46.0)
HEMOGLOBIN: 12.9 g/dL (ref 12.0–15.0)
Lymphocytes Relative: 37 % (ref 12–46)
Lymphs Abs: 2.1 10*3/uL (ref 0.7–4.0)
MCH: 27 pg (ref 26.0–34.0)
MCHC: 32.9 g/dL (ref 30.0–36.0)
MCV: 82 fL (ref 78.0–100.0)
MONOS PCT: 5 % (ref 3–12)
Monocytes Absolute: 0.3 10*3/uL (ref 0.1–1.0)
Neutro Abs: 3.2 10*3/uL (ref 1.7–7.7)
Neutrophils Relative %: 56 % (ref 43–77)
Platelets: 247 10*3/uL (ref 150–400)
RBC: 4.78 MIL/uL (ref 3.87–5.11)
RDW: 14.2 % (ref 11.5–15.5)
WBC: 5.8 10*3/uL (ref 4.0–10.5)

## 2014-06-24 LAB — POCT UA - MICROSCOPIC ONLY

## 2014-06-24 LAB — POCT URINALYSIS DIPSTICK
Bilirubin, UA: NEGATIVE
Glucose, UA: NEGATIVE
KETONES UA: NEGATIVE
Leukocytes, UA: NEGATIVE
Nitrite, UA: NEGATIVE
PH UA: 7
PROTEIN UA: NEGATIVE
SPEC GRAV UA: 1.015
Urobilinogen, UA: 0.2

## 2014-06-24 LAB — BASIC METABOLIC PANEL
BUN: 10 mg/dL (ref 6–23)
CALCIUM: 8.9 mg/dL (ref 8.4–10.5)
CO2: 26 mEq/L (ref 19–32)
Chloride: 104 mEq/L (ref 96–112)
Creat: 0.54 mg/dL (ref 0.50–1.10)
Glucose, Bld: 86 mg/dL (ref 70–99)
Potassium: 4 mEq/L (ref 3.5–5.3)
Sodium: 136 mEq/L (ref 135–145)

## 2014-06-24 LAB — POCT URINE PREGNANCY: Preg Test, Ur: NEGATIVE

## 2014-06-24 MED ORDER — CETIRIZINE HCL 10 MG PO TABS: 10.0000 mg | ORAL_TABLET | Freq: Every day | ORAL | Status: DC

## 2014-06-24 NOTE — Patient Instructions (Addendum)
Blood in urine/pain with urination - check CT abdomen and pelvis, check basic lab work  Itching - attempt trial of zyrtec

## 2014-06-24 NOTE — Progress Notes (Signed)
   Subjective:    Patient ID: Kristine Hayden, female    DOB: 09/15/1976, 38 y.o.   MRN: [id number]  HPI 38 y/o female presents for follow up of dysuria. Interview completed with help of phone Spanish interpretor.   Dysuria - has been present for 2-3 months, has been evaluated by her PCP in July, UA negative for infection however hematuria noted, patient was supposed to be referred to urology however they required CT abd/pelvis first, patient was told that she needed to be seen for follow up prior to having this ordered, from records it appears that she needed a pregnancy test before getting the CT, she reports do vaginal discharge, bilateral flank tenderness, no fevers/chills however does admit to generalized malaise over the past few months  Pruritis - patient also reports bilateral hand/wrist pruritis, present since July, started on PO steroid and topical steroid at that time, no rash to date, itching sometimes affects chest and bilateral upper extremities  Review of Systems  Constitutional: Positive for fatigue. Negative for fever.  Gastrointestinal: Positive for constipation. Negative for nausea, vomiting, abdominal pain and diarrhea.  Musculoskeletal: Positive for back pain.       Objective:   Physical Exam Vitals: reviewed Gen: pleasant Hispanic female, accompanied by son Cardiac: RRR, S1 and S2 present, no murmurs, no heaves/thrills Resp: CTAB, normal effort Abd: mild bilateral lower quadrant tenderness, normal bowel sounds, no rebound/guarding GYN: deferred, completed 04/03/14 (unremarkable at that time except for mild cervical motion tenderness) GU: mild bilateral flank tenderness  POC urine dipstick showed trace blood POC urine pregnancy test negative 04/03/14 wet prep negative     Assessment & Plan:  Please see problem specific assessment and plan.

## 2014-06-24 NOTE — Assessment & Plan Note (Signed)
Rash is resolved however patient reports chronic pruritis. -attempt trial of Zyrtec -continue hydrocortisone cream PRN severe symptoms.

## 2014-06-24 NOTE — Assessment & Plan Note (Signed)
Patient presents for evaluation of dysuria. Urine dipstick negative. No vaginal/GYN complaints. Has had previous wet prep in July that was negative.  -No further workup planned at this time -CT abd/pelvis ordered for workup of hematuria.

## 2014-06-24 NOTE — Assessment & Plan Note (Addendum)
Hematuria still present. CT abdomen and Pelvis has not been completed. -order placed and patient scheduled by nursing staff - check BMP and CBC

## 2014-06-25 ENCOUNTER — Encounter: Payer: Self-pay | Admitting: Family Medicine

## 2014-06-26 ENCOUNTER — Ambulatory Visit (HOSPITAL_COMMUNITY)
Admission: RE | Admit: 2014-06-26 | Discharge: 2014-06-26 | Disposition: A | Discharge status: Home / Self Care | Payer: Self-pay | Source: Ambulatory Visit | Attending: Family Medicine | Admitting: Family Medicine

## 2014-06-26 ENCOUNTER — Encounter (HOSPITAL_COMMUNITY): Payer: Self-pay

## 2014-06-26 ENCOUNTER — Other Ambulatory Visit: Payer: Self-pay | Admitting: Family Medicine

## 2014-06-26 DIAGNOSIS — K429 Umbilical hernia without obstruction or gangrene: Secondary | ICD-10-CM | POA: Insufficient documentation

## 2014-06-26 DIAGNOSIS — R319 Hematuria, unspecified: Secondary | ICD-10-CM

## 2014-06-28 ENCOUNTER — Telehealth: Payer: Self-pay | Admitting: Family Medicine

## 2014-06-28 ENCOUNTER — Emergency Department (HOSPITAL_COMMUNITY)
Admission: EM | Admit: 2014-06-28 | Discharge: 2014-06-28 | Disposition: A | Discharge status: Home / Self Care | Payer: Self-pay | Attending: Emergency Medicine | Admitting: Emergency Medicine

## 2014-06-28 ENCOUNTER — Encounter (HOSPITAL_COMMUNITY): Payer: Self-pay | Admitting: Emergency Medicine

## 2014-06-28 DIAGNOSIS — R209 Unspecified disturbances of skin sensation: Secondary | ICD-10-CM | POA: Insufficient documentation

## 2014-06-28 DIAGNOSIS — M546 Pain in thoracic spine: Secondary | ICD-10-CM | POA: Insufficient documentation

## 2014-06-28 DIAGNOSIS — M542 Cervicalgia: Secondary | ICD-10-CM | POA: Insufficient documentation

## 2014-06-28 DIAGNOSIS — R319 Hematuria, unspecified: Secondary | ICD-10-CM

## 2014-06-28 DIAGNOSIS — G8929 Other chronic pain: Secondary | ICD-10-CM | POA: Insufficient documentation

## 2014-06-28 DIAGNOSIS — R51 Headache: Secondary | ICD-10-CM | POA: Insufficient documentation

## 2014-06-28 MED ORDER — ONDANSETRON 4 MG PO TBDP: 4.0000 mg | ORAL_TABLET | Freq: Three times a day (TID) | ORAL | Status: DC | PRN

## 2014-06-28 MED ORDER — PREDNISONE 20 MG PO TABS: 40.0000 mg | ORAL_TABLET | Freq: Every day | ORAL | Status: DC

## 2014-06-28 NOTE — Discharge Instructions (Signed)
Radiculopata cervical (Cervical Radiculopathy)  La radiculopata cervical se produce cuando un nervio del cuello se comprime o es desplazado un disco herniado o por cambios artrticos en los huesos de la columna cervical. Esto puede ocurrir debido a una lesin o como parte del proceso normal de envejecimiento. La presin [employer] nervios cervicales pueden causar dolor o adormecimiento que se extiende desde el cuello hacia los brazos y los dedos.  CAUSAS  Hay numerosas causas que [employer], entre las que se incluyen:   Traumatismos.  Rigidez de los msculos del cuello por el uso excesivo.  Articulaciones que duelen y que se hinchan (artritis).  Desgaste o degeneracin de los huesos y las articulaciones de la columna (espondilosis) debido al proceso de envejecimiento.  Espolones seos que pueden desarrollarse cerca de los nervios cervicales. SNTOMAS  Los sntomas son dolor, debilidad o adormecimiento en la mano y en el brazo afectados. El dolor puede ser intenso o irritante. Los sntomas pueden empeorar al extender o torcer el cuello.  DIAGNSTICO  El mdico le preguntar acerca de sus sntomas y le har un examen fsico. Warehouse manager de poner a prueba su fuerza y   sus reflejos. Le indicarn radiografas, una tomografa computada y Health visitor en caso de traumatismos o si los sntomas no desaparecen despus de cierto perodo de [place]. Podrn hacerle una electromiografa (EMG) o una prueba de conduccin nerviosa para estudiar el funcionamiento de sus nervios y msculos.  TRATAMIENTO  El mdico podr recomendar algunos ejercicios para [employer] sntomas. La radiculopata puede, y con frecuencia se logra, mejorar con el tiempo y un tratamiento. Si los sntomas continan, las opciones de tratamiento son:  Usar un collar blando durante un tiempo breve.  Fisioterapia para fortalecer los msculos del cuello.  Medicamentos, como los antinflamatorios no esteroideos (AINES),  corticoides por va oral o inyecciones en la columna vertebral.  Ciruga. Segn la causa del problema podrn implementarse diferentes tipos de [place]. INSTRUCCIONES PARA EL CUIDADO DOMICILIARIO  Aplique hielo sobre la zona afectada.  Ponga el hielo en una bolsa plstica.  Colquese una toalla entre la piel y la bolsa de hielo.  Deje el hielo durante 15 a 20 minutos 3 a 4 veces por da, o segn las indicaciones del mdico.  Si el hielo no ayuda, puede Charity fundraiser. Tome una ducha o bao caliente, o use una bolsa de agua caliente segn las indicaciones de su mdico.  Puede intentar con un masaje suave en el cuello y los hombros.  Por la noche duerma con una almohada plana.  Utilice los medicamentos de venta libre o de prescripcin para Chief Technology Officer, Environmental health practitioner o la [place], segn se lo indique el profesional que lo asiste.  Si le indican fisioterapia, siga las indicaciones de su mdico.  Si le indican un collar blando, selo segn las indicaciones. SOLICITE ATENCIN MDICA DE INMEDIATO SI:  El dolor empeora mucho y no puede controlarlo con medicamentos.  Siente debilidad o adormecimiento en la mano, el brazo, el rostro o la pierna.  Le sube la fiebre o tiene el cuello rgido.  Pierde el control del intestino o de la vejiga (incontinencia).  Tiene dificultad para caminar, para mantener el equilibrio o para hablar. EST SEGURO QUE:   Comprende estas instrucciones.  Controlar su enfermedad.  Solicitar ayuda de inmediato si no mejora o si empeora. Document Released: 06/30/2005 Document Revised: 12/13/2011 [facility] Patient Information 2015 [place], [place]. This information is not intended to replace advice given to you by your  health care provider. Make sure you discuss any questions you have with your health care provider. Cervical Radiculopathy Cervical radiculopathy happens when a nerve in the neck is pinched or bruised by a slipped (herniated) disk or by arthritic  changes in the bones of the cervical spine. This can occur due to an injury or as part of the normal aging process. Pressure on the cervical nerves can cause pain or numbness that runs from your neck all the way down into your arm and fingers. CAUSES  There are many possible causes, including:  Injury.  Muscle tightness in the neck from overuse.  Swollen, painful joints (arthritis).  Breakdown or degeneration in the bones and joints of the spine (spondylosis) due to aging.  Bone spurs that may develop near the cervical nerves. SYMPTOMS  Symptoms include pain, weakness, or numbness in the affected arm and hand. Pain can be severe or irritating. Symptoms may be worse when extending or turning the neck. DIAGNOSIS  Your caregiver will ask about your symptoms and do a physical exam. He or she may test your strength and reflexes. X-rays, CT scans, and MRI scans may be needed in cases of injury or if the symptoms do not go away after a period of time. Electromyography (EMG) or nerve conduction testing may be done to study how your nerves and muscles are working. TREATMENT  Your caregiver may recommend certain exercises to help relieve your symptoms. Cervical radiculopathy can, and often does, get better with time and treatment. If your problems continue, treatment options may include:  Wearing a soft collar for short periods of time.  Physical therapy to strengthen the neck muscles.  Medicines, such as nonsteroidal anti-inflammatory drugs (NSAIDs), oral corticosteroids, or spinal injections.  Surgery. Different types of surgery may be done depending on the cause of your problems. HOME CARE INSTRUCTIONS   Put ice on the affected area.  Put ice in a plastic bag.  Place a towel between your skin and the bag.  Leave the ice on for 15-20 minutes, 03-04 times a day or as directed by your caregiver.  If ice does not help, you can try using heat. Take a warm shower or bath, or use a hot water  bottle as directed by your caregiver.  You may try a gentle neck and shoulder massage.  Use a flat pillow when you sleep.  Only take over-the-counter or prescription medicines for pain, discomfort, or fever as directed by your caregiver.  If physical therapy was prescribed, follow your caregiver's directions.  If a soft collar was prescribed, use it as directed. SEEK IMMEDIATE MEDICAL CARE IF:   Your pain gets much worse and cannot be controlled with medicines.  You have weakness or numbness in your hand, arm, face, or leg.  You have a high fever or a stiff, rigid neck.  You lose bowel or bladder control (incontinence).  You have trouble with walking, balance, or speaking. MAKE SURE YOU:   Understand these instructions.  Will watch your condition.  Will get help right away if you are not doing well or get worse. Document Released: 06/15/2001 Document Revised: 12/13/2011 Document Reviewed: 05/04/2011 [facility] Patient Information 2015 [place], [place]. This information is not intended to replace advice given to you by your health care provider. Make sure you discuss any questions you have with your health care provider.

## 2014-06-28 NOTE — Telephone Encounter (Signed)
Called patient with Spanish Interpretor through the language line [phone]), discussed CT results and lab work, patient reports urethral/vaingal pain and associated nausea, patient informed that a referral to urology has been placed and will call in a medication for nausea, no cause of her pain has been identified as of yet

## 2014-06-28 NOTE — ED Provider Notes (Signed)
CSN: [id number]     Arrival date & time 06/28/14  2000 History  This chart was scribed for non-physician practitioner working with Flint Melter, MD by Elveria Rising, ED Scribe. This patient was seen in room TR10C/TR10C and the patient's care was started at 9:15 PM.   Chief Complaint  Patient presents with  . Neck Pain   The history is provided by the patient and a relative. A language interpreter was used.   HPI Comments: Kristine Hayden is a 38 y.o. female who presents to the Emergency Department with chief complaint of exacerbated chronic neck and thoraric back pain, for 2 years.  States it has worsened for two weeks now. Patient reports back pain with sitting, head pain, and tingling in her feet. Patient reports nausea as result of her severe pain. Dr. Pollie Meyer, PCP, gave patient pain medication, but she denies relief with treatment. She received a referral for nerve conduction study by Dr. Pollie Meyer.  She had unremarkable MRIs earlier in the year.  No bowel or bladder incontinence.  No difficulty walking.     History reviewed. No pertinent past medical history. Past Surgical History  Procedure Laterality Date  . Cesarean section     No family history on file. History  Substance Use Topics  . Smoking status: Never Smoker   . Smokeless tobacco: Not on file  . Alcohol Use: No   OB History   Grav Para Term Preterm Abortions TAB SAB Ect Mult Living   Review of Systems  Constitutional: Negative for fever and chills.  Musculoskeletal: Positive for back pain and neck pain. Negative for gait problem.  Neurological: Positive for numbness and headaches. Negative for weakness.      Allergies  Review of patient's allergies indicates no known allergies.  Home Medications   Prior to Admission medications   Medication Sig Start Date End Date Taking? Authorizing Provider  ondansetron (ZOFRAN-ODT) 4 MG disintegrating tablet Take 4 mg by mouth every  8 (eight) hours as needed for nausea or vomiting. 06/28/14   Uvaldo Rising, MD   Triage Vitals: BP 122/64  Pulse 57  Temp(Src) 98.1 F (36.7 C) (Oral)  Resp 20  Wt 135 lb (61.236 kg)  SpO2 98%  LMP 05/26/2014  Physical Exam  Nursing note and vitals reviewed. Constitutional: She is oriented to person, place, and time. She appears well-developed and well-nourished. No distress.  HENT:  Head: Normocephalic and atraumatic.  Eyes: Conjunctivae and EOM are normal. Right eye exhibits no discharge. Left eye exhibits no discharge. No scleral icterus.  Neck: Normal range of motion. Neck supple. No tracheal deviation present.  Cardiovascular: Normal rate, regular rhythm and normal heart sounds.  Exam reveals no gallop and no friction rub.   No murmur heard. Pulmonary/Chest: Effort normal and breath sounds normal. No respiratory distress. She has no wheezes.  Abdominal: Soft. She exhibits no distension. There is no tenderness.  Musculoskeletal: Normal range of motion. She exhibits tenderness.  Cervical paraspinal muscles tender to palpation, no bony tenderness, step-offs, or gross abnormality or deformity of spine, patient is able to ambulate, moves all extremities  Bilateral great toe extension intact Bilateral plantar/dorsiflexion intact  Neurological: She is alert and oriented to person, place, and time. She has normal reflexes.  Sensation and strength intact bilaterally Symmetrical reflexes  Skin: Skin is warm and dry. She is not diaphoretic.  Psychiatric: She has a normal mood and  affect. Her behavior is normal. Judgment and thought content normal.    ED Course  Procedures (including critical care time)  COORDINATION OF CARE: 9:19 PM- Will prescribe pain medication. Patient advised to follow up with Dr. Jodelle Red treatment plan with patient at bedside and patient agreed to plan.   Labs Review Labs Reviewed - No data to display  Imaging Review No results found.   EKG  Interpretation None      MDM   Final diagnoses:  Neck pain    Patient with back pain.  No neurological deficits and normal neuro exam.  Patient is ambulatory.  No loss of bowel or bladder control.  Doubt cauda equina.  Denies fever,  doubt epidural abscess or other lesion. Recommend back exercises, stretching, RICE, and will treat with a short course of prednisone.  She received a referral for nerve conduction study by Dr. Pollie Meyer.  She had unremarkable MRIs earlier in the year.  No bowel or bladder incontinence.  No difficulty walking.  She has close follow-up.  No emergent findings tonight.  Encouraged the patient that there could be a need for additional workup and/or imaging such as additional MRI, if the symptoms do not resolve. Patient advised that if the back pain does not resolve, or radiates, this could progress to more serious conditions and is encouraged to follow-up with PCP or orthopedics within 2 weeks.     I personally performed the services described in this documentation, which was scribed in my presence. The recorded information has been reviewed and is accurate.    Roxy Horseman, PA-C 06/28/14 2136

## 2014-06-28 NOTE — ED Notes (Signed)
The pt is c/o paion in her neck and back for 2 years from  FALL.  THE PAIN IS WORSE FOR THE PAST 2 WEEKS

## 2014-06-28 NOTE — ED Notes (Signed)
Rob, PA-C, at the bedside.

## 2014-06-28 NOTE — ED Provider Notes (Signed)
Medical screening examination/treatment/procedure(s) were performed by non-physician practitioner and as supervising physician I was immediately available for consultation/collaboration.  Maheen Cwikla L Zemira Zehring, MD 06/28/14 2322 

## 2014-07-01 ENCOUNTER — Ambulatory Visit: Payer: Self-pay | Admitting: Family Medicine

## 2014-07-01 NOTE — Telephone Encounter (Signed)
Ramon Meza Cma, left message on voicemail for patient to schedule an appointment with Britta Mccreedy for orange card re-certification before we can process referral.

## 2014-07-18 ENCOUNTER — Emergency Department (HOSPITAL_COMMUNITY)
Admission: EM | Admit: 2014-07-18 | Discharge: 2014-07-18 | Disposition: A | Discharge status: Home / Self Care | Payer: Self-pay | Attending: Emergency Medicine | Admitting: Emergency Medicine

## 2014-07-18 ENCOUNTER — Telehealth: Payer: Self-pay | Admitting: Family Medicine

## 2014-07-18 ENCOUNTER — Encounter (HOSPITAL_COMMUNITY): Payer: Self-pay | Admitting: Emergency Medicine

## 2014-07-18 ENCOUNTER — Emergency Department (HOSPITAL_COMMUNITY): Payer: Self-pay

## 2014-07-18 DIAGNOSIS — Z7952 Long term (current) use of systemic steroids: Secondary | ICD-10-CM | POA: Insufficient documentation

## 2014-07-18 DIAGNOSIS — R51 Headache: Secondary | ICD-10-CM | POA: Insufficient documentation

## 2014-07-18 DIAGNOSIS — M542 Cervicalgia: Secondary | ICD-10-CM | POA: Insufficient documentation

## 2014-07-18 DIAGNOSIS — M549 Dorsalgia, unspecified: Secondary | ICD-10-CM | POA: Insufficient documentation

## 2014-07-18 DIAGNOSIS — R519 Headache, unspecified: Secondary | ICD-10-CM

## 2014-07-18 LAB — CBC WITH DIFFERENTIAL/PLATELET
BASOS PCT: 0 % (ref 0–1)
Basophils Absolute: 0 10*3/uL (ref 0.0–0.1)
EOS ABS: 0.2 10*3/uL (ref 0.0–0.7)
Eosinophils Relative: 3 % (ref 0–5)
HCT: 35.5 % — ABNORMAL LOW (ref 36.0–46.0)
Hemoglobin: 11.9 g/dL — ABNORMAL LOW (ref 12.0–15.0)
LYMPHS ABS: 2.5 10*3/uL (ref 0.7–4.0)
Lymphocytes Relative: 32 % (ref 12–46)
MCH: 27 pg (ref 26.0–34.0)
MCHC: 33.5 g/dL (ref 30.0–36.0)
MCV: 80.5 fL (ref 78.0–100.0)
Monocytes Absolute: 0.5 10*3/uL (ref 0.1–1.0)
Monocytes Relative: 6 % (ref 3–12)
NEUTROS PCT: 59 % (ref 43–77)
Neutro Abs: 4.7 10*3/uL (ref 1.7–7.7)
PLATELETS: 217 10*3/uL (ref 150–400)
RBC: 4.41 MIL/uL (ref 3.87–5.11)
RDW: 13.6 % (ref 11.5–15.5)
WBC: 7.9 10*3/uL (ref 4.0–10.5)

## 2014-07-18 LAB — BASIC METABOLIC PANEL
Anion gap: 11 (ref 5–15)
BUN: 15 mg/dL (ref 6–23)
CHLORIDE: 103 meq/L (ref 96–112)
CO2: 25 mEq/L (ref 19–32)
Calcium: 9.1 mg/dL (ref 8.4–10.5)
Creatinine, Ser: 0.45 mg/dL — ABNORMAL LOW (ref 0.50–1.10)
GFR calc Af Amer: >90 mL/min (ref >90)
Glucose, Bld: 99 mg/dL (ref 70–99)
Potassium: 3.6 mEq/L — ABNORMAL LOW (ref 3.7–5.3)
SODIUM: 139 meq/L (ref 137–147)

## 2014-07-18 MED ORDER — CYCLOBENZAPRINE HCL 10 MG PO TABS: 10.0000 mg | ORAL_TABLET | Freq: Two times a day (BID) | ORAL | Status: DC | PRN

## 2014-07-18 MED ORDER — HYDROCODONE-ACETAMINOPHEN 5-325 MG PO TABS: 1.0000 | ORAL_TABLET | Freq: Four times a day (QID) | ORAL | Status: DC | PRN

## 2014-07-18 MED ORDER — HYDROCODONE-ACETAMINOPHEN 5-325 MG PO TABS
1.0000 | ORAL_TABLET | Freq: Once | ORAL | Status: AC
  Administered 2014-07-18: 1 via ORAL
  Filled 2014-07-18: qty 1

## 2014-07-18 MED ORDER — NAPROXEN 500 MG PO TABS: 500.0000 mg | ORAL_TABLET | Freq: Two times a day (BID) | ORAL | Status: DC

## 2014-07-18 NOTE — Telephone Encounter (Signed)
Pt requesting test results from when she went to the hospital, pt only speaks spanish

## 2014-07-18 NOTE — ED Notes (Signed)
Informed RN that pt stated that she has numbness in her face.

## 2014-07-18 NOTE — ED Notes (Signed)
Patient presents stating she has pain to her neck and numbness to her face.  States this happened before but not as bad.  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  She hurt her back and neck.  Seen here for the same

## 2014-07-18 NOTE — ED Provider Notes (Signed)
CSN: [id number]     Arrival date & time 07/18/14  0028 History   First MD Initiated Contact with Patient 07/18/14 0145     Chief Complaint  Patient presents with  . Tingling     (Consider location/radiation/quality/duration/timing/severity/associated sxs/prior Treatment) The history is provided by the patient and a relative.   38 year old female with complaint of headache neck pain and some low back pain. Neck pain is 10 out of 10 low back pain is 4/10. Also complaining of some left arm numbness and left leg numbness and numbness to the bottom of both feet. Patient was seen on the end of September for neck pain. Also by looking at records had MRI of neck and back in April of this year that were negative for any acute findings. Patient denies fever denies any other symptoms. Patient states symptoms have been present for 2 weeks.  History reviewed. No pertinent past medical history. Past Surgical History  Procedure Laterality Date  . Cesarean section     No family history on file. History  Substance Use Topics  . Smoking status: Never Smoker   . Smokeless tobacco: Not on file  . Alcohol Use: No   OB History   Grav Para Term Preterm Abortions TAB SAB Ect Mult Living   8 6   2  2   6      Review of Systems  Constitutional: Negative for fever.  HENT: Negative for congestion and trouble swallowing.   Eyes: Negative for visual disturbance.  Respiratory: Negative for shortness of breath.   Cardiovascular: Negative for chest pain.  Gastrointestinal: Negative for nausea, vomiting and abdominal pain.  Genitourinary: Negative for dysuria.  Musculoskeletal: Positive for back pain and neck pain.  Skin: Negative for rash.  Neurological: Positive for numbness and headaches. Negative for weakness.  Hematological: Does not bruise/bleed easily.  Psychiatric/Behavioral: Negative for confusion.      Allergies  Review of patient's allergies indicates no known allergies.  Home Medications    Prior to Admission medications   Medication Sig Start Date End Date Taking? Authorizing Provider  predniSONE (DELTASONE) 20 MG tablet Take 2 tablets (40 mg total) by mouth daily. 06/28/14  Yes Roxy Horsemanobert Browning, PA-C  cyclobenzaprine (FLEXERIL) 10 MG tablet Take 1 tablet (10 mg total) by mouth 2 (two) times daily as needed for muscle spasms. 07/18/14   Vanetta MuldersScott Sriansh Farra, MD  HYDROcodone-acetaminophen (NORCO/VICODIN) 5-325 MG per tablet Take 1-2 tablets by mouth every 6 (six) hours as needed for moderate pain. 07/18/14   Vanetta MuldersScott Khaiden Segreto, MD  naproxen (NAPROSYN) 500 MG tablet Take 1 tablet (500 mg total) by mouth 2 (two) times daily. 07/18/14   Vanetta MuldersScott Olivine Hiers, MD   BP 116/76  Pulse 62  Temp(Src) 98 F (36.7 C) (Oral)  Resp 14  Ht 5\' 2"  (1.575 m)  Wt 135 lb (61.236 kg)  BMI 24.69 kg/m2  SpO2 99%  LMP 05/26/2014 Physical Exam  Nursing note and vitals reviewed. Constitutional: She is oriented to person, place, and time. She appears well-developed and well-nourished. No distress.  HENT:  Head: Normocephalic and atraumatic.  Mouth/Throat: Oropharynx is clear and moist.  Eyes: Conjunctivae and EOM are normal. Pupils are equal, round, and reactive to light.  Neck: Normal range of motion. Neck supple.  Cardiovascular: Normal rate, regular rhythm and normal heart sounds.   No murmur heard. Pulmonary/Chest: Effort normal and breath sounds normal.  Abdominal: Soft. Bowel sounds are normal. There is no tenderness.  Musculoskeletal: Normal range of motion. She exhibits no  edema.  Neurological: She is alert and oriented to person, place, and time. No cranial nerve deficit. She exhibits normal muscle tone. Coordination normal.  Nondermatomal subjective numbness to left arm. Also a subjective numbness to the bottom of the feet bilaterally.  Skin: Skin is warm.    ED Course  Procedures (including critical care time) Labs Review Labs Reviewed  CBC WITH DIFFERENTIAL - Abnormal; Notable for the  following:    Hemoglobin 11.9 (*)    HCT 35.5 (*)    All other components within normal limits  BASIC METABOLIC PANEL - Abnormal; Notable for the following:    Potassium 3.6 (*)    Creatinine, Ser 0.45 (*)    All other components within normal limits   Results for orders placed during the hospital encounter of 07/18/14  CBC WITH DIFFERENTIAL      Result Value Ref Range   WBC 7.9  4.0 - 10.5 K/uL   RBC 4.41  3.87 - 5.11 MIL/uL   Hemoglobin 11.9 (*) 12.0 - 15.0 g/dL   HCT 09.835.5 (*) 11.936.0 - 14.746.0 %   MCV 80.5  78.0 - 100.0 fL   MCH 27.0  26.0 - 34.0 pg   MCHC 33.5  30.0 - 36.0 g/dL   RDW 82.913.6  56.211.5 - 13.015.5 %   Platelets 217  150 - 400 K/uL   Neutrophils Relative % 59  43 - 77 %   Neutro Abs 4.7  1.7 - 7.7 K/uL   Lymphocytes Relative 32  12 - 46 %   Lymphs Abs 2.5  0.7 - 4.0 K/uL   Monocytes Relative 6  3 - 12 %   Monocytes Absolute 0.5  0.1 - 1.0 K/uL   Eosinophils Relative 3  0 - 5 %   Eosinophils Absolute 0.2  0.0 - 0.7 K/uL   Basophils Relative 0  0 - 1 %   Basophils Absolute 0.0  0.0 - 0.1 K/uL  BASIC METABOLIC PANEL      Result Value Ref Range   Sodium 139  137 - 147 mEq/L   Potassium 3.6 (*) 3.7 - 5.3 mEq/L   Chloride 103  96 - 112 mEq/L   CO2 25  19 - 32 mEq/L   Glucose, Bld 99  70 - 99 mg/dL   BUN 15  6 - 23 mg/dL   Creatinine, Ser 8.650.45 (*) 0.50 - 1.10 mg/dL   Calcium 9.1  8.4 - 78.410.5 mg/dL   GFR calc non Af Amer >90  >90 mL/min   GFR calc Af Amer >90  >90 mL/min   Anion gap 11  5 - 15     Imaging Review Ct Head Wo Contrast  07/18/2014   CLINICAL DATA:  Tingling involving the scalp radiating count 2 neck and lower back for 2 weeks. Left leg numbness and tingling. Initial encounter.  EXAM: CT HEAD WITHOUT CONTRAST  CT CERVICAL SPINE WITHOUT CONTRAST  TECHNIQUE: Multidetector CT imaging of the head and cervical spine was performed following the standard protocol without intravenous contrast. Multiplanar CT image reconstructions of the cervical spine were also  generated.  COMPARISON:  None.  Cervical spine MRI - 01/30/2014  FINDINGS: CT HEAD FINDINGS  Gray-white differentiation is maintained. No CT evidence of acute large territory infarct. No intraparenchymal or extra-axial mass or hemorrhage. Normal size and configuration of the ventricles and basilar cisterns. No midline shift. Limited visualization the paranasal sinuses and mastoid air cells are normal. No air-fluid levels. Regional soft tissues appear normal. No  displaced calvarial fracture.  CT CERVICAL SPINE FINDINGS  C1 to the superior endplate of T3 is imaged.  Normal alignment of the cervical spine. No anterolisthesis or retrolisthesis. The bilateral facets are normally aligned. The dens is normally positioned and a lateral masses of C1. Normal atlantodental and toe axial articulations.  Tiny ossicles are noted about the base of the occiput. No fracture or static subluxation of the cervical spine. Cervical vertebral body heights are preserved. Prevertebral soft tissues are normal. Intervertebral disc spaces are preserved. There is minimal ossification involving the anterior inferior aspect of the C4-C5 intervertebral disc.  Scattered shotty bilateral cervical lymph nodes are individually not enlarged the humerus. Normal noncontrast appearance of the thyroid gland. Limited visualization of lung apices is normal.  IMPRESSION: 1. Negative noncontrast head CT. 2. No fracture or static subluxation of the cervical spine.   Electronically Signed   By: Simonne Come M.D.   On: 07/18/2014 03:16   Ct Cervical Spine Wo Contrast  07/18/2014   CLINICAL DATA:  Tingling involving the scalp radiating count 2 neck and lower back for 2 weeks. Left leg numbness and tingling. Initial encounter.  EXAM: CT HEAD WITHOUT CONTRAST  CT CERVICAL SPINE WITHOUT CONTRAST  TECHNIQUE: Multidetector CT imaging of the head and cervical spine was performed following the standard protocol without intravenous contrast. Multiplanar CT image  reconstructions of the cervical spine were also generated.  COMPARISON:  None.  Cervical spine MRI - 01/30/2014  FINDINGS: CT HEAD FINDINGS  Gray-white differentiation is maintained. No CT evidence of acute large territory infarct. No intraparenchymal or extra-axial mass or hemorrhage. Normal size and configuration of the ventricles and basilar cisterns. No midline shift. Limited visualization the paranasal sinuses and mastoid air cells are normal. No air-fluid levels. Regional soft tissues appear normal. No displaced calvarial fracture.  CT CERVICAL SPINE FINDINGS  C1 to the superior endplate of T3 is imaged.  Normal alignment of the cervical spine. No anterolisthesis or retrolisthesis. The bilateral facets are normally aligned. The dens is normally positioned and a lateral masses of C1. Normal atlantodental and toe axial articulations.  Tiny ossicles are noted about the base of the occiput. No fracture or static subluxation of the cervical spine. Cervical vertebral body heights are preserved. Prevertebral soft tissues are normal. Intervertebral disc spaces are preserved. There is minimal ossification involving the anterior inferior aspect of the C4-C5 intervertebral disc.  Scattered shotty bilateral cervical lymph nodes are individually not enlarged the humerus. Normal noncontrast appearance of the thyroid gland. Limited visualization of lung apices is normal.  IMPRESSION: 1. Negative noncontrast head CT. 2. No fracture or static subluxation of the cervical spine.   Electronically Signed   By: Simonne Come M.D.   On: 07/18/2014 03:16     EKG Interpretation None      MDM   Final diagnoses:  Headache, unspecified headache type  Neck pain   Workup for the headache and neck pain. Also with some degree of low back pain but most of it was neck. Radiation of pain into the left arm. Without any obvious focal findings despite the subjective complaint of numbness and tingling to the left arm. Does not follow a  dermatome pattern. Patient's had MRI of neck and spine in April seen by neurosurgery. This had no significant findings. Patient also seen recently for the neck pain at the end of September. Patient does have a primary care Dr. recommending followup. Today's head CT in and the neck without any acute findings.  We'll treat with muscle relaxers anti-inflammatories and pain medicine. Patient without fever no concern for meningitis. No leukocytosis. Labs essentially normal other than a mild hypokalemia.    Vanetta Mulders, MD 07/18/14 [phone]

## 2014-07-18 NOTE — Discharge Instructions (Signed)
CT scan of head and neck without any acute findings compared to your MRI results from April of this year. Followup with your regular Dr. Zachery Conchake pain medicine as directed. Take anti-inflammatory medicine as directed and take a muscle relaxer Flexeril as directed. Rest for the next couple days. Return for any new or worse symptoms.

## 2014-07-18 NOTE — ED Notes (Signed)
Pt. reports tingling at scalp radiating down to neck and lower back for 2 weeks , left leg numbness/tingling , left forearm pain . Denies fever or chills/ respirations unlabored / ambulatory.

## 2014-07-19 ENCOUNTER — Emergency Department (HOSPITAL_COMMUNITY)
Admission: EM | Admit: 2014-07-19 | Discharge: 2014-07-19 | Disposition: A | Discharge status: Home / Self Care | Payer: Self-pay | Attending: Emergency Medicine | Admitting: Emergency Medicine

## 2014-07-19 ENCOUNTER — Encounter (HOSPITAL_COMMUNITY): Payer: Self-pay | Admitting: Emergency Medicine

## 2014-07-19 DIAGNOSIS — M545 Low back pain, unspecified: Secondary | ICD-10-CM

## 2014-07-19 DIAGNOSIS — Z79899 Other long term (current) drug therapy: Secondary | ICD-10-CM | POA: Insufficient documentation

## 2014-07-19 DIAGNOSIS — R202 Paresthesia of skin: Secondary | ICD-10-CM | POA: Insufficient documentation

## 2014-07-19 DIAGNOSIS — M542 Cervicalgia: Secondary | ICD-10-CM | POA: Insufficient documentation

## 2014-07-19 DIAGNOSIS — Z7952 Long term (current) use of systemic steroids: Secondary | ICD-10-CM | POA: Insufficient documentation

## 2014-07-19 DIAGNOSIS — R51 Headache: Secondary | ICD-10-CM | POA: Insufficient documentation

## 2014-07-19 MED ORDER — OXYCODONE-ACETAMINOPHEN 5-325 MG PO TABS
2.0000 | ORAL_TABLET | Freq: Once | ORAL | Status: AC
  Administered 2014-07-19: 2 via ORAL
  Filled 2014-07-19: qty 2

## 2014-07-19 NOTE — ED Provider Notes (Signed)
CSN: [id number]     Arrival date & time 07/19/14  1123 History   First MD Initiated Contact with Patient 07/19/14 1559     Chief Complaint  Patient presents with  . Neck Pain  . Back Pain     (Consider location/radiation/quality/duration/timing/severity/associated sxs/prior Treatment) The history is provided by the patient and medical records. The history is limited by a language barrier. A language interpreter was used.    Kristine Hayden is a 38 y.o. female  with no major medical Hx presents to the Emergency Department complaining of gradual, persistent, progressively worsening neck and back pain onset 2 weeks ago, but has been evaluated for the same pain in the past, including by neurosurgery in April of 2015 with MRI at that time which was without acute findings. Pt reports this pain is the result of a fall 2 years ago, but denies any falls since that time. She reports she took the medication prescribed at this mornings visit, which did help, but the pain returned several hours later. Associated symptoms include subjective paresthesias of the left face, left arm, left leg and soles of the bilateral feet.  Patient also endorses associated headache, waxing and waning for the last 2 weeks located in the posterior occiput and described as sharp, rated at a 5/10. She denies associated visual changes, weakness in her extremities, falls.  Pt denies fever, night sweats, weight loss, h/o cancer, IVDU.  Rotations given this morning makes the pain better and movement makes it worse.  Patient reports she has been ambulatory without difficulty or new falls for the last several weeks. She has not yet followed up with her primary care physician about these symptoms.   History reviewed. No pertinent past medical history. Past Surgical History  Procedure Laterality Date  . Cesarean section     History reviewed. No pertinent family history. History  Substance Use Topics  . Smoking status:  Never Smoker   . Smokeless tobacco: Not on file  . Alcohol Use: No   OB History   Grav Para Term Preterm Abortions TAB SAB Ect Mult Living   8 6   2  2   6      Review of Systems  Constitutional: Negative for fever and fatigue.  Respiratory: Negative for chest tightness and shortness of breath.   Cardiovascular: Negative for chest pain.  Gastrointestinal: Negative for nausea, vomiting, abdominal pain and diarrhea.  Genitourinary: Negative for dysuria, urgency, frequency and hematuria.  Musculoskeletal: Positive for back pain and neck pain. Negative for gait problem, joint swelling and neck stiffness.  Skin: Negative for rash.  Neurological: Positive for numbness (paresthesias) and headaches. Negative for weakness and light-headedness.  All other systems reviewed and are negative.     Allergies  Review of patient's allergies indicates no known allergies.  Home Medications   Prior to Admission medications   Medication Sig Start Date End Date Taking? Authorizing Provider  cyclobenzaprine (FLEXERIL) 10 MG tablet Take 1 tablet (10 mg total) by mouth 2 (two) times daily as needed for muscle spasms. 07/18/14   Vanetta Mulders, MD  HYDROcodone-acetaminophen (NORCO/VICODIN) 5-325 MG per tablet Take 1-2 tablets by mouth every 6 (six) hours as needed for moderate pain. 07/18/14   Vanetta Mulders, MD  naproxen (NAPROSYN) 500 MG tablet Take 1 tablet (500 mg total) by mouth 2 (two) times daily. 07/18/14   Vanetta Mulders, MD  predniSONE (DELTASONE) 20 MG tablet Take 2 tablets (40 mg total) by mouth daily. 06/28/14   Molly Maduro  Browning, PA-C   BP 103/74  Pulse 62  Temp(Src) 98.5 F (36.9 C) (Oral)  Resp 18  SpO2 99%  LMP 05/26/2014 Physical Exam  Nursing note and vitals reviewed. Constitutional: She is oriented to person, place, and time. She appears well-developed and well-nourished. No distress.  HENT:  Head: Normocephalic and atraumatic.  Mouth/Throat: Oropharynx is clear and moist. No  oropharyngeal exudate.  Eyes: Conjunctivae and EOM are normal. Pupils are equal, round, and reactive to light. No scleral icterus.  No horizontal, vertical or rotational nystagmus  Neck: Normal range of motion. Neck supple.  Full active and passive ROM with mild pain No midline tenderness Mild bilateral paraspinal tenderness of the C-spine No nuchal rigidity or meningeal signs  Cardiovascular: Normal rate, regular rhythm, normal heart sounds and intact distal pulses.   RRR - no tachycardia  Pulmonary/Chest: Effort normal and breath sounds normal. No respiratory distress. She has no wheezes. She has no rales.  Clear and equal breath sounds  Abdominal: Soft. Bowel sounds are normal. She exhibits no distension. There is no tenderness. There is no rebound and no guarding.  Abd soft and nontender No palpable or pulsatile mass  Musculoskeletal: Normal range of motion.  Full range of motion of the T-spine and L-spine with mild pain No tenderness to palpation of the spinous processes of the T-spine or L-spine Mild tenderness to palpation of the bilateral paraspinous muscles of the T-spine and L-spine  Lymphadenopathy:    She has no cervical adenopathy.  Neurological: She is alert and oriented to person, place, and time. She has normal reflexes. No cranial nerve deficit. She exhibits normal muscle tone. Coordination normal.  Mental Status:  Alert, oriented, thought content appropriate. Speech fluent without evidence of aphasia. Able to follow 2 step commands without difficulty.  Cranial Nerves:  II:  Peripheral visual fields grossly normal, pupils equal, round, reactive to light III,IV, VI: ptosis not present, extra-ocular motions intact bilaterally  V,VII: smile symmetric, facial light touch sensation equal VIII: hearing grossly normal bilaterally  IX,X: gag reflex present  XI: bilateral shoulder shrug equal and strong XII: midline tongue extension  Motor:  5/5 in upper and lower extremities  bilaterally including strong and equal grip strength and dorsiflexion/plantar flexion Sensory: Pinprick and light touch normal in all extremities.  Deep Tendon Reflexes: 2+ and symmetric  Cerebellar: normal finger-to-nose with bilateral upper extremities Gait: normal gait and balance; no foot drop or dragging of either leg CV: distal pulses palpable throughout  No clonus  Skin: Skin is warm and dry. No rash noted. She is not diaphoretic. No erythema.  Psychiatric: She has a normal mood and affect. Her behavior is normal. Judgment and thought content normal.    ED Course  Procedures (including critical care time) Labs Review Labs Reviewed - No data to display  Imaging Review Ct Head Wo Contrast  07/18/2014   CLINICAL DATA:  Tingling involving the scalp radiating count 2 neck and lower back for 2 weeks. Left leg numbness and tingling. Initial encounter.  EXAM: CT HEAD WITHOUT CONTRAST  CT CERVICAL SPINE WITHOUT CONTRAST  TECHNIQUE: Multidetector CT imaging of the head and cervical spine was performed following the standard protocol without intravenous contrast. Multiplanar CT image reconstructions of the cervical spine were also generated.  COMPARISON:  None.  Cervical spine MRI - 01/30/2014  FINDINGS: CT HEAD FINDINGS  Gray-white differentiation is maintained. No CT evidence of acute large territory infarct. No intraparenchymal or extra-axial mass or hemorrhage. Normal size and configuration  of the ventricles and basilar cisterns. No midline shift. Limited visualization the paranasal sinuses and mastoid air cells are normal. No air-fluid levels. Regional soft tissues appear normal. No displaced calvarial fracture.  CT CERVICAL SPINE FINDINGS  C1 to the superior endplate of T3 is imaged.  Normal alignment of the cervical spine. No anterolisthesis or retrolisthesis. The bilateral facets are normally aligned. The dens is normally positioned and a lateral masses of C1. Normal atlantodental and toe  axial articulations.  Tiny ossicles are noted about the base of the occiput. No fracture or static subluxation of the cervical spine. Cervical vertebral body heights are preserved. Prevertebral soft tissues are normal. Intervertebral disc spaces are preserved. There is minimal ossification involving the anterior inferior aspect of the C4-C5 intervertebral disc.  Scattered shotty bilateral cervical lymph nodes are individually not enlarged the humerus. Normal noncontrast appearance of the thyroid gland. Limited visualization of lung apices is normal.  IMPRESSION: 1. Negative noncontrast head CT. 2. No fracture or static subluxation of the cervical spine.   Electronically Signed   By: Simonne ComeJohn  Watts M.D.   On: 07/18/2014 03:16   Ct Cervical Spine Wo Contrast  07/18/2014   CLINICAL DATA:  Tingling involving the scalp radiating count 2 neck and lower back for 2 weeks. Left leg numbness and tingling. Initial encounter.  EXAM: CT HEAD WITHOUT CONTRAST  CT CERVICAL SPINE WITHOUT CONTRAST  TECHNIQUE: Multidetector CT imaging of the head and cervical spine was performed following the standard protocol without intravenous contrast. Multiplanar CT image reconstructions of the cervical spine were also generated.  COMPARISON:  None.  Cervical spine MRI - 01/30/2014  FINDINGS: CT HEAD FINDINGS  Gray-white differentiation is maintained. No CT evidence of acute large territory infarct. No intraparenchymal or extra-axial mass or hemorrhage. Normal size and configuration of the ventricles and basilar cisterns. No midline shift. Limited visualization the paranasal sinuses and mastoid air cells are normal. No air-fluid levels. Regional soft tissues appear normal. No displaced calvarial fracture.  CT CERVICAL SPINE FINDINGS  C1 to the superior endplate of T3 is imaged.  Normal alignment of the cervical spine. No anterolisthesis or retrolisthesis. The bilateral facets are normally aligned. The dens is normally positioned and a lateral  masses of C1. Normal atlantodental and toe axial articulations.  Tiny ossicles are noted about the base of the occiput. No fracture or static subluxation of the cervical spine. Cervical vertebral body heights are preserved. Prevertebral soft tissues are normal. Intervertebral disc spaces are preserved. There is minimal ossification involving the anterior inferior aspect of the C4-C5 intervertebral disc.  Scattered shotty bilateral cervical lymph nodes are individually not enlarged the humerus. Normal noncontrast appearance of the thyroid gland. Limited visualization of lung apices is normal.  IMPRESSION: 1. Negative noncontrast head CT. 2. No fracture or static subluxation of the cervical spine.   Electronically Signed   By: Simonne ComeJohn  Watts M.D.   On: 07/18/2014 03:16     EKG Interpretation None      Record review:   MRI L-Spine April 2015  FINDINGS:  Normal conus tip at L1-2. Normal paraspinal soft tissues.  T11-12 through L4-5: Normal.  L5-S1: Minimal desiccation of the disc. No disc bulge or protrusion.  Slight degenerative changes of the facet joints. No foraminal or  spinal stenosis.  IMPRESSION:  Minimal degenerative changes of the facet joints at L5-S1.  Otherwise, essentially normal MRI of the lumbar spine.    MRI C-Spine April 2015  FINDINGS:  The visualized intracranial contents, paraspinal  soft tissues, and  cervical spinal cord are normal. There is no facet arthritis in the  cervical spine. There is no foraminal or spinal stenosis.  C1-2 through C5-6: Normal.  C6-7: Tiny central disc bulge with no neural impingement. Otherwise  normal.  C7-T1 through T2-3: Normal.  IMPRESSION:  No significant abnormality of the cervical spine.   MDM   Final diagnoses:  Neck pain  Bilateral low back pain without sciatica  Paresthesias   Timmothy EulerMaria Rosa Hayden presents with headache, neck pain and back pain with associated subjective paresthesias, but normal neurologic  exam.  No clonus to suggest transverse myelitis, no loss of bowel or bladder control, saddle anesthesia or gait disturbance to suggest cauda equina.  Pt with CT head and cervical spine this AM without acute abnormality or subacute infarct.      No neurological deficits and normal neuro exam.  Patient can walk without difficulty or gait disturbance but states is painful.  No fever, night sweats, weight loss, h/o cancer, IVDU.  Pt was given antiinflammatory, pain control and muscle relaxer this AM.  Pain controlled here in the ED and recommend close follow-up with PCP within 3 days.    I have personally reviewed patient's vitals, nursing note and any pertinent labs or imaging.  I performed an undressed physical exam.    It has been determined that no acute conditions requiring further emergency intervention are present at this time. The patient/guardian have been advised of the diagnosis and plan. I reviewed all labs and imaging including any potential incidental findings. We have discussed signs and symptoms that warrant return to the ED, such as loss of bowel or bladder control.  Patient/guardian has voiced understanding and agreed to follow-up as discussed.  Vital signs are stable at discharge.   BP 103/74  Pulse 62  Temp(Src) 98.5 F (36.9 C) (Oral)  Resp 18  SpO2 99%  LMP 05/26/2014        Dierdre ForthHannah Mishaal Lansdale, PA-C 07/19/14 1712

## 2014-07-19 NOTE — ED Notes (Signed)
Pt c/o neck and back pain worse with movement; pt sts pain in legs and feet; pt sts some numbness in fingers with pain x 2 weeks

## 2014-07-19 NOTE — Discharge Instructions (Signed)
1. Medications: use medications prescribed this morning in addition to your usual home medications 2. Treatment: rest, drink plenty of fluids, gentle stretching as discussed, alternate ice and heat 3. Follow Up: Please followup with your primary doctor in 3 days for discussion of your diagnoses and further evaluation after today's visit;    Ejercicios para la espalda (Back Exercises) Estos ejercicios ayudan a tratar y a prevenir lesiones en la espalda. El objetivo es aumentar la fuerza en los msculos del vientre (abdomen) y de la espalda. Estos ejercicios tambin lo ayudarn a mejorar la flexibilidad. Comience a realizar estos ejercicios cuando el mdico se lo indique. CUIDADOS EN EL HOGAR Los ejercicios para la espalda incluyen: Inclinacin de la pelvis.  Recustese sobre la espalda con las rodillas flexionadas. Incline la pelvis hasta que la parte inferior de la espalda se apoye en el piso. Mantenga esta posicin durante 5 a 10 segundos. Repita este ejercicio 5 a 10 veces. Rodilla al pecho.  Empuje con la rodilla contra el pecho y Hawthornemantenga esta posicin durante 20 a 30 segundos. Repita con la otra pierna. Esto puede realizarlo con la otra pierna extendida o flexionada, del modo en que se sienta ms cmodo. Luego presione ambas rodillas contra el pecho. Abdominales.  Doble las rodillas a 90 grados. Comience doblando la pelvis y haga un abdominal parcial y en forma lenta. Slo eleve la parte superior a 30  45 grados del suelo. Emplee al BJ's Wholesalemenos entre 2 y 3 segundos para cada abdominal. No haga los abdominales con las rodillas extendidas. Si hacer abdominales parciales le resulta difcil, simplemente haga el ejercicio pero slo endureciendo los msculos del vientre(abdomen) y manteniendo segn la indicacin. Elevar la cadera.  Recustese sobre la espalda con las rodillas flexionadas a 90 grados. Presione con los pies y los hombros a medida que eleva las caderas a 5 cm del suelo. Mantenga durante  10 segundos y repita 5 a 10 veces. Arquear la espalda.  Acustese Eli Lilly and Companysobre el estmago. Levntese apoyando los codos doblados. Presione lentamente con las manos, formando un arco con la zona inferior de la espalda. Repita entre 3 y 5 veces. Elevar los hombros.  Acustese boca abajo con los brazos a los lados del cuerpo. Presione las caderas y Dance movement psychotherapistel torso contra el suelo mientras eleva lentamente la cabeza y los hombros del suelo. No exagere al ARAMARK Corporationhacer los ejercicios. Tenga cuidado al principio. Los ejercicios pueden causar algunas molestias leves en la espalda. Si el dolor dura ms de 15 minutos, detenga los ejercicios hasta que consulte al mdico. Los problemas en la espalda mejoran de Enhautmanera lenta con esta terapia.  Document Released: 01/05/2011 Document Revised: 12/13/2011 [employer]ExitCare Patient Information 2015 Search[place]ExitCare, [place]LLC. This information is not intended to replace advice given to you by your health care provider. Make sure you discuss any questions you have with your health care provider.    Parestesia (Paresthesia) La parestesia es una sensacin de ardor o cosquilleo. Puede ocurrir en cualquier parte del cuerpo, pero se siente ms a WellPointmenudo en las manos, los brazos, las piernas o los pies. CUIDADOS EN EL HOGAR   Evite consumir alcohol.  Pruebe con masajes o con una terapia con agujas (acupuntura) para mejorar los sntomas.  Cumpla con los controles mdicos segn las indicaciones. SOLICITE AYUDA DE INMEDIATO SI:   Se siente dbil.  Tiene dificultad para caminar o moverse.  Tiene problemas para hablar o en la visin.  Se siente confundido.  No puede controlar su materia fecal (movimientos intestinales) o  el pis orina.  Siente adormecimiento luego de un traumatismo.  Se desmaya (se desvanece).  La sensacin de ardor o pinchazos empeoran al caminar.  Tiene dolor, calambres, o mareos.  Tiene una erupcin. ASEGRESE DE QUE:   Comprende estas instrucciones.  Controlar la  enfermedad.  Solicitar ayuda de inmediato si usted no mejora o si empeora. Document Released: 10/23/2010 Document Revised: 12/13/2011 [facility] Patient Information 2015 [place]ExitCare, [place]LLC. This information is not intended to replace advice given to you by your health care provider. Make sure you discuss any questions you have with your health care provider.

## 2014-07-19 NOTE — ED Notes (Signed)
PA Edrees Valent at the bedside. Utilized Music therapisttranslator phone. Patient complains of headache, neck pain, and numbness and tingling to the left arm and bilateral legs. Patient made aware she needs a follow up appointment with her MD and a outpatient MRI. Verbalized understanding.

## 2014-07-20 NOTE — ED Provider Notes (Signed)
Medical screening examination/treatment/procedure(s) were performed by non-physician practitioner and as supervising physician I was immediately available for consultation/collaboration.  Christopher J. Pollina, MD 07/20/14 1707 

## 2014-07-23 NOTE — Telephone Encounter (Signed)
Patient informed of results at latest ED visit, had Hale Droneamon Meza, CMA call and leave message informing patient to call back to schedule a follow up for headaches.

## 2014-08-01 ENCOUNTER — Ambulatory Visit (INDEPENDENT_AMBULATORY_CARE_PROVIDER_SITE_OTHER): Payer: Self-pay | Admitting: Family Medicine

## 2014-08-01 ENCOUNTER — Encounter: Payer: Self-pay | Admitting: Family Medicine

## 2014-08-01 VITALS — BP 130/80 | HR 58 | Temp 98.5°F | Wt 137.0 lb

## 2014-08-01 DIAGNOSIS — M549 Dorsalgia, unspecified: Secondary | ICD-10-CM

## 2014-08-01 DIAGNOSIS — R200 Anesthesia of skin: Secondary | ICD-10-CM

## 2014-08-01 DIAGNOSIS — R202 Paresthesia of skin: Secondary | ICD-10-CM

## 2014-08-01 MED ORDER — GABAPENTIN 100 MG PO CAPS: 100.0000 mg | ORAL_CAPSULE | Freq: Three times a day (TID) | ORAL | Status: DC

## 2014-08-01 NOTE — Patient Instructions (Signed)
Nice to meet you. We will call with your MRI schedule. If you have any weakness, loss of bowel or bladder function, fever, or change in speech please seek medical attention.

## 2014-08-01 NOTE — Progress Notes (Signed)
Patient ID: Timmothy EulerMaria Rosa Martinez-Encarnacion, female   DOB: 1975/11/03, 38 y.o.   MRN: [id number]  Marikay AlarEric Mahira Petras, MD Phone: [phone]  Timmothy EulerMaria Rosa Martinez-Encarnacion is a 38 y.o. female who presents today for same day appointment.   [employer] interpretor Nile RiggsMariel Gallego present for this encounter.  Patient presents today for her chronic back pain and chronic numbness of her left posterior forearm and left lateral thigh. She notes this all started 2 years ago when she fell at work. She notes this has progressively gotten worse. She was evaluated for this in the ED on 07/19/14. She had a negative CT head and CT cervical spine at that time. She has previously been seen by ortho for this issue and had MR lumbar and cervical spine films that revealed only mild degenerative changes of L5-S1 facet joints. She reports that she has continued to have sensation of numbness of the posterior aspect aspect of her left forearm and left lateral thigh. She notes pain in her left palm when she places pressure on this. She notes an out of balance sensation and infrequent blurry vision only when walking. She notes her vision at this time is normal. She notes overall weakness when walking, though no specific location of weakness. She continues to endorse bilateral facial "tightness" in both cheeks. She denies bowel and urinary incontinence, saddle anesthesia, fevers, and history of cancer. She denies history of abuse and depression. She states this has all worsened in the past 1.5 months. Has been taking vicodin, flexeril, and naproxen since ED visit. Had lab evaluation for this in the spring with no abnormalities noted.   Patient is a nonsmoker.   ROS: Per HPI   Physical Exam Filed Vitals:   08/01/14 1615  BP: 130/80  Pulse: 58  Temp: 98.5 F (36.9 C)    Gen: Well NAD HEENT: PERRL,  MMM Lungs: CTABL Nl WOB Heart: RRR no MRG Neuro: CN 2-12 intact, 5/5 strength in bilateral biceps, triceps, grip, quads,  hamstrings, plantar and dorsiflexion, sensation to light touch mildly diminished in left posterior fore arm and left lateral thigh, otherwise intact in bilateral UE and LE, normal gait, 2+ patellar reflexes, normal rhomberg, no pronator drift MSK: back with no palpable swelling, patient reports tenderness of bilateral paraspinous muscles throughout the length of her back, there is no apparent spasm, she has full ROM of her neck, she has pain in her back on spurling testing though no radicular symptoms Exts: Non edematous BL  LE, warm and well perfused.    Assessment/Plan: Please see individual problem list.  Marikay AlarEric Marissia Blackham, MD [facility] PGY-3

## 2014-08-03 NOTE — Assessment & Plan Note (Signed)
No red flags. She is to continue current medication regimen for this issue.

## 2014-08-03 NOTE — Assessment & Plan Note (Addendum)
Facial tingling bilaterally and sensation of numbness in discrete locations in left fore arm and left lateral thigh. This is a chronic issue. Etiology of this remains unclear at this time. Her work up thus far has been negative including recent CT scan head and cervical spine, prior MR cervical spine and lumbar spine, and lab work up including CBC, CMET, TSH, ANA, A1c, and RF. Patient denied abuse and depression at this visit as those issues can manifest as a conversion issue. Given combination of this tingling, numbness, and intermittent blurry vision could consider MS as a cause. Also consider pyschosomatic cause, though must complete work-up for organic cause. I had a long discussion with the patient regarding the options moving forward. I reassured her that her work up to this point has been negative for a cause. The next step in work up would be MRI brain and/or referral to neurology. At this time the patient does not have insurance, though has applied for charity care today. After discussion with the patient it was decided to proceed with ordering the MRI brain to evaluate for potential causes of this issue. Would consider referral to neurology once obtains the orange card. Will start her on gabapentin for neuropathy. Given return precautions. She will follow-up with her PCP in 2 weeks for this issue.   Precepted with Dr Lum BabeEniola

## 2014-08-05 ENCOUNTER — Encounter: Payer: Self-pay | Admitting: Family Medicine

## 2014-08-14 ENCOUNTER — Ambulatory Visit: Payer: Self-pay

## 2014-08-21 ENCOUNTER — Ambulatory Visit: Payer: Self-pay

## 2014-08-27 ENCOUNTER — Ambulatory Visit (INDEPENDENT_AMBULATORY_CARE_PROVIDER_SITE_OTHER): Payer: Self-pay | Admitting: Sports Medicine

## 2014-08-27 ENCOUNTER — Encounter: Payer: Self-pay | Admitting: Sports Medicine

## 2014-08-27 VITALS — BP 116/75 | HR 71 | Ht 60.0 in | Wt 137.0 lb

## 2014-08-27 DIAGNOSIS — M549 Dorsalgia, unspecified: Secondary | ICD-10-CM

## 2014-08-27 DIAGNOSIS — R202 Paresthesia of skin: Secondary | ICD-10-CM

## 2014-08-27 DIAGNOSIS — R200 Anesthesia of skin: Secondary | ICD-10-CM

## 2014-08-27 MED ORDER — GABAPENTIN 100 MG PO CAPS: 100.0000 mg | ORAL_CAPSULE | Freq: Three times a day (TID) | ORAL | Status: DC

## 2014-08-27 MED ORDER — IBUPROFEN 800 MG PO TABS: 800.0000 mg | ORAL_TABLET | Freq: Three times a day (TID) | ORAL | Status: DC | PRN

## 2014-08-27 NOTE — Progress Notes (Signed)
   Subjective:    Patient ID: Kristine Hayden, female    DOB: [birth year], 38 y.o.   MRN: [id number]  HPI Patient presents today for follow-up chronic lower back and neck pain and chronic numbness of her left posterior forearm and left lateral thigh. She notes this all started 2 years ago when she fell at work. She notes this has progressively gotten worse. Her work-up has included MR lumbar and cervical spine ([facility]-> reads available for review today, which were overall unremarkable). A previous lumbar spine MRI from earlier this year revealed only mild degenerative changes of L5-S1 facet joints. She notes pain in her left palm when she places pressure on this. She the sensation of vertigo and infrequent blurry vision with associated headaches. She denies any nausea associated with this. She describes intermittent weakness of the entire left leg. She continues to endorse bilateral facial "tightness" in both cheeks. She denies bowel and urinary incontinence, saddle anesthesia, fevers, and history of cancer. She denies history of abuse and depression. She says she ran out of her previously prescribed gabapentin 100mg  tid and ibuprofen 800mg , which she says were helping her symptoms when she was taking them. Her PCP also ordered an MRI of her brain and planned for Neurology referral, due to concern for possible degenerative neurologic process.  Past medical history, social history, medications, and allergies were reviewed and are up to date in the chart.  Review of Systems 7 point review of systems was performed and was otherwise negative unless noted in the history of present illness.     Objective:   Physical Exam BP 116/75 mmHg  Pulse 71  Ht 5' (1.524 m)  Wt 137 lb (62.143 kg)  BMI 26.76 kg/m2  LMP 07/29/2014 GEN: The patient is well-developed well-nourished female and in no acute distress.  She is awake alert and oriented  x3. EYE: PERRLA, EOMI, no nystagmus SKIN: warm and well-perfused, no rash  EXTR: No lower extremity edema or calf tenderness Neuro: Strength 5/5 globally. Sensation intact throughout, with the exception of left dorsal forearm, right mid-thoracic spine, and left lateral thigh. DTRs 2/4 bilaterally. No focal deficits. Vasc: +2 bilateral distal pulses. No edema.  MSK: Examination of the cervical spine reveals full range of motion with mild pain with flexion. She has no bony step-off or tenderness to palpation. She has paraspinal muscle tenderness to palpation at the level of T7-T10 with tissue texture changes. She has full strength bilateral lower extremities and no foot drop. She has no swelling or nodules of any joints.     Assessment & Plan:  Please see problem based assessment and plan in the problem list.

## 2014-08-27 NOTE — Assessment & Plan Note (Signed)
Component of muscle spasm, however, associated numbness, weakness, and diffuse neurologic symptoms (vision changes, vertigo, facial numbness) in lieu of essentially normal cervical and lumbar spine MRIs are concerning for a neuropathic-mediated process such as chronic regional pain syndrome, fibromyalgia, or at worst MS. -Agree with PCP's plan to obtain MRI brain and recommend follow-up with Neurology consultation. -I refilled her gabapentin 100mg  po tid -I refilled her ibuprofen 800mg  with instructions on use and GI precautions of side effects -She will follow-up as needed

## 2014-08-27 NOTE — Assessment & Plan Note (Signed)
Component of muscle spasm, however, associated numbness, weakness, and diffuse neurologic symptoms (vision changes, vertigo, facial numbness) in lieu of essentially normal cervical and lumbar spine MRIs are concerning for a neuropathic-mediated process such as chronic regional pain syndrome, fibromyalgia, or at worst MS. -Agree with PCP's plan to obtain MRI brain and recommend follow-up with Neurology consultation. -I refilled her gabapentin 100mg po tid -I refilled her ibuprofen 800mg with instructions on use and GI precautions of side effects -She will follow-up as needed 

## 2014-09-23 ENCOUNTER — Ambulatory Visit: Payer: Self-pay

## 2014-10-14 ENCOUNTER — Ambulatory Visit (INDEPENDENT_AMBULATORY_CARE_PROVIDER_SITE_OTHER): Payer: Self-pay | Admitting: Family Medicine

## 2014-10-14 ENCOUNTER — Encounter: Payer: Self-pay | Admitting: Family Medicine

## 2014-10-14 VITALS — BP 117/74 | HR 60 | Temp 97.7°F | Ht 60.0 in | Wt 141.0 lb

## 2014-10-14 DIAGNOSIS — K429 Umbilical hernia without obstruction or gangrene: Secondary | ICD-10-CM

## 2014-10-14 DIAGNOSIS — R312 Other microscopic hematuria: Secondary | ICD-10-CM

## 2014-10-14 DIAGNOSIS — R350 Frequency of micturition: Secondary | ICD-10-CM

## 2014-10-14 DIAGNOSIS — R3129 Other microscopic hematuria: Secondary | ICD-10-CM

## 2014-10-14 DIAGNOSIS — M549 Dorsalgia, unspecified: Secondary | ICD-10-CM

## 2014-10-14 LAB — POCT URINALYSIS DIPSTICK
Bilirubin, UA: NEGATIVE
Glucose, UA: NEGATIVE
KETONES UA: NEGATIVE
LEUKOCYTES UA: NEGATIVE
Nitrite, UA: NEGATIVE
Protein, UA: NEGATIVE
SPEC GRAV UA: 1.02
UROBILINOGEN UA: 0.2
pH, UA: 7

## 2014-10-14 LAB — POCT UA - MICROSCOPIC ONLY

## 2014-10-14 MED ORDER — RANITIDINE HCL 150 MG PO TABS: 150.0000 mg | ORAL_TABLET | Freq: Two times a day (BID) | ORAL | Status: DC

## 2014-10-14 MED ORDER — GABAPENTIN 100 MG PO CAPS: 100.0000 mg | ORAL_CAPSULE | Freq: Three times a day (TID) | ORAL | Status: DC

## 2014-10-14 NOTE — Patient Instructions (Signed)
Take gabapentin 3 times daily as needed Take ranitidine twice daily for reflux symptoms I'll look into orange card situation I am referring you to surgeon for your hernia. You will get a phone call to schedule this appointment.  Follow up with me in 6 weeks  Be well, Dr. Pollie MeyerMcIntyre

## 2014-10-15 LAB — URINE CULTURE
Colony Count: NO GROWTH
ORGANISM ID, BACTERIA: NO GROWTH

## 2014-10-16 ENCOUNTER — Telehealth: Payer: Self-pay | Admitting: Family Medicine

## 2014-10-16 DIAGNOSIS — R3129 Other microscopic hematuria: Secondary | ICD-10-CM

## 2014-10-16 NOTE — Telephone Encounter (Signed)
Nothing in chart, will forward to PCP.

## 2014-10-16 NOTE — Telephone Encounter (Signed)
Patient asked about a MRI brain and urologist referral. Please, follow up with Patient.   By Aileen Fassosa A Delgado Martin

## 2014-10-16 NOTE — Telephone Encounter (Signed)
Both of these items are pending her getting the orange card. Britta MccreedyBarbara, what is pt's current status with orange card? Thanks, Latrelle DodrillBrittany J Tyra Gural, MD

## 2014-10-17 ENCOUNTER — Encounter: Payer: Self-pay | Admitting: Family Medicine

## 2014-10-17 NOTE — Progress Notes (Signed)
Patient called because she has severe headache and urinary difficulties. She is waiting for referrals: MRI of her head, urologist. Please, follow up with Patient.

## 2014-10-17 NOTE — Progress Notes (Signed)
See previous phone note.  

## 2014-10-20 NOTE — Progress Notes (Signed)
Patient ID: Kristine Hayden, female   DOB: 1976-05-06, 39 y.o.   MRN: [id number]  Spanish interpreter utilized during this visit.   HPI:  Urinary complaints - c/o frequency, urgency, and dysuria for the last month. Urinating 4-5 times per night. No fevers.  MRI head - ordered by Dr. Birdie SonsSonnenberg back in October as pt complained of sx concerning for possible MS (blurry vision, focal numbness). Pt has not been able to get this because she does not have a payer source. Has tried to work on getting the orange card but doesn't yet have it. Continues to c/o back and neck pain, burning in her eyes, and needle sensations in her extremities. Not taking gabapentin as previously prescribed.  Abd discomfort: thinks she might have a hernia. Gets occasional abdominal discomfort. Eating and drinking fine. Also endorsing some reflux sx's, not on any meds.  ROS: See HPI.  PMFSH: chronic back pain, many vague somatic complaints  PHYSICAL EXAM: BP 117/74 mmHg  Pulse 60  Temp(Src) 97.7 F (36.5 C) (Oral)  Ht 5' (1.524 m)  Wt 141 lb (63.957 kg)  BMI 27.54 kg/m2 Gen: NAD HEENT: NCAT Heart: RRR Lungs: CTAB NWOB Abd: soft NTTP, no hernia palpable but then small periumbilical hernia seen bulging with intraabdominal straining Neuro: grossly nonfocal speech normal, full strength bilat lower ext, sensation intact to bilat lower ext. EOMI, face symmetric Ext: atraumatic, no edema  ASSESSMENT/PLAN:  Back pain Continues to c/o back pain with vague peripheral complaints. Agree that MRI head is a good study to check to rule out intracranial causes. Unfortunately this can't happen until pt gets the orange card. Stressed to pt the importance of following up with Abundio MiuBarbara McGregor about this. I will also message Britta MccreedyBarbara to check on the status of financial assistance. Add gabapentin for peripheral neuropathy sxs. F/u 6 weeks.   Umbilical hernia without obstruction and without gangrene Hernia noted Sept  2015 on CT abd. No signs of incarceration or strangulation. Will refer to general surgery for possible elective repair. Ranitidine for reflux sx's.   Microscopic hematuria UA today without signs of overt infection. Will send culture to ensure not infected. Pt still needs to be seen by urology for persistent hematuria, but again limiting factor is not having orange card set up. Again stressed to pt importance of trying to get this. F/u in 6 weeks.    FOLLOW UP: F/u in 6 weeks for above problems Referring to gen surg for hernia  GrenadaBrittany J. Pollie MeyerMcIntyre, MD [facility]

## 2014-10-21 DIAGNOSIS — K429 Umbilical hernia without obstruction or gangrene: Secondary | ICD-10-CM | POA: Insufficient documentation

## 2014-10-21 NOTE — Assessment & Plan Note (Addendum)
Hernia noted Sept 2015 on CT abd. No signs of incarceration or strangulation. Will refer to general surgery for possible elective repair. Ranitidine for reflux sx's.

## 2014-10-21 NOTE — Telephone Encounter (Signed)
Pt now have Orange card need referral to urologist

## 2014-10-21 NOTE — Telephone Encounter (Signed)
Will forward to MD to place referral

## 2014-10-21 NOTE — Assessment & Plan Note (Signed)
UA today without signs of overt infection. Will send culture to ensure not infected. Pt still needs to be seen by urology for persistent hematuria, but again limiting factor is not having orange card set up. Again stressed to pt importance of trying to get this. F/u in 6 weeks.

## 2014-10-21 NOTE — Assessment & Plan Note (Signed)
Continues to c/o back pain with vague peripheral complaints. Agree that MRI head is a good study to check to rule out intracranial causes. Unfortunately this can't happen until pt gets the orange card. Stressed to pt the importance of following up with Kristine Hayden about this. I will also message Britta MccreedyBarbara to check on the status of financial assistance. Add gabapentin for peripheral neuropathy sxs. F/u 6 weeks.

## 2014-10-22 NOTE — Telephone Encounter (Signed)
Referral entered. Please inform patient that this has been done. Also, can we go ahead and schedule her brain MRI?  Thanks, Latrelle DodrillBrittany J Tyquasia Pant, MD

## 2014-10-24 ENCOUNTER — Ambulatory Visit (INDEPENDENT_AMBULATORY_CARE_PROVIDER_SITE_OTHER): Payer: Self-pay | Admitting: Family Medicine

## 2014-10-24 ENCOUNTER — Encounter: Payer: Self-pay | Admitting: Family Medicine

## 2014-10-24 VITALS — BP 107/69 | HR 65 | Temp 97.9°F | Ht 60.0 in | Wt 141.1 lb

## 2014-10-24 DIAGNOSIS — R300 Dysuria: Secondary | ICD-10-CM

## 2014-10-24 DIAGNOSIS — N3001 Acute cystitis with hematuria: Secondary | ICD-10-CM

## 2014-10-24 LAB — POCT URINALYSIS DIPSTICK
Bilirubin, UA: NEGATIVE
Glucose, UA: NEGATIVE
KETONES UA: NEGATIVE
Nitrite, UA: NEGATIVE
Protein, UA: NEGATIVE
SPEC GRAV UA: 1.02
Urobilinogen, UA: 0.2
pH, UA: 7

## 2014-10-24 LAB — POCT UA - MICROSCOPIC ONLY

## 2014-10-24 MED ORDER — SULFAMETHOXAZOLE-TRIMETHOPRIM 800-160 MG PO TABS: 1.0000 | ORAL_TABLET | Freq: Two times a day (BID) | ORAL | Status: DC

## 2014-10-24 NOTE — Progress Notes (Signed)
   Subjective:    Patient ID: Kristine Hayden, female    DOB: 08-26-1976, 39 y.o.   MRN: [id number]  In-person interpretation for Spanish-speaking patient Kristine Hayden(Kristine Hayden, [facility]).  HPI: Pt presents to clinic for SDA visit for dysuria for about 2 months, shooting, sharp pain in her low abdomen "above" her vagina, every time she urinates. She endorses increased frequency / urge, with some small-volume voiding and dribbling, and the sensation of incomplete emptying. She denies frank blood in her urine. She denies known history of kidney stones. Of note, pt has a referral pending to urology, placed a few days ago; pt was seen by Dr. Pollie MeyerMcIntyre (her PCP) on 1/11 with similar complaints, though urine culture at that time was negative.  Of note, pt has many vaguely-described somatic complaints and is apparently awaiting an MRI; this is in the process of being scheduled.  Review of Systems: As above.     Objective:   Physical Exam BP 107/69 mmHg  Pulse 65  Temp(Src) 97.9 F (36.6 C) (Oral)  Ht 5' (1.524 m)  Wt 141 lb 1.6 oz (64.003 kg)  BMI 27.56 kg/m2 Gen: non-toxic-appearing adult female in NAD HEENT: [place]/AT, EOMI, PERRLA; TM's clear bilaterally Cardio: RRR, no murmur appreciated Pulm: CTAB, no wheezes Abd: soft, nondistended, BS+  Diffusely tender in low abdomen, especially suprapubically  NO CVA tenderness, no flank pain Ext: warm, well-perfused  UA / micro: microscopic hematuria with few WBC's, trace leukocyte esterase     Assessment & Plan:  39yo female with symptoms consistent with UTI, though UA is underwhelming - favor treatment given duration of symptoms and consistent history and exam - Rx Bactrim BID for 7 days - will culture urine and f/u result as appropriate (i.e., stop abx early if no growth) - advised pt that referral to urology already sent in and that she should hear from them soon about an appointment time - f/u with Dr. Pollie MeyerMcIntyre PRN; defer concerns over MRI /  multiple somatic to her, at this time  Note FYI to Dr. Levora DredgeMcIntyre  Anelisse Jacobson M Kina Shiffman, MD PGY-3, [facility] 10/24/2014, 12:56 PM

## 2014-10-24 NOTE — Patient Instructions (Signed)
Thank you for coming in, today!  I think you might have an infection. I will prescribe an antibiotic for 7 days, twice per day. You have been referred to the urologist, who can help us figure out how best to treat your pain.  Come back to see Dr. Pollie MeyerMcIntyre as you need, otherwise. Please feel free to call with any questions or concerns at any time, at [phone]. --Dr. Casper HarrisonStreet

## 2014-10-25 LAB — URINE CULTURE
Colony Count: NO GROWTH
Organism ID, Bacteria: NO GROWTH

## 2014-10-28 ENCOUNTER — Telehealth: Payer: Self-pay | Admitting: Family Medicine

## 2014-10-28 NOTE — Telephone Encounter (Signed)
Results telephone call. NOTE: Pt is Spanish-speaking only.  Red Team: Please call pt to let her know that her urine culture was negative -- if she is getting any relief from her antibiotics, she can continue them until she finishes. If she feels they are not helping, she can stop them early. She should follow up with Dr. Pollie MeyerMcIntyre, as needed. Her urology referral has been ordered but I do not think she has an appointment, yet, and should be contacted soon with an appointment date / time. Thanks!  FYI to Dr. Pollie MeyerMcIntyre.  --CMS

## 2014-10-28 NOTE — Telephone Encounter (Signed)
[facility] referral for urology faxed, [facility], patient informed by Hale Droneamon Meza CMA

## 2014-10-31 NOTE — Telephone Encounter (Signed)
Patient informed. 

## 2014-11-12 ENCOUNTER — Ambulatory Visit (HOSPITAL_COMMUNITY)
Admission: RE | Admit: 2014-11-12 | Discharge: 2014-11-12 | Disposition: A | Discharge status: Home / Self Care | Payer: Self-pay | Source: Ambulatory Visit | Attending: Family Medicine | Admitting: Family Medicine

## 2014-11-12 DIAGNOSIS — Z8673 Personal history of transient ischemic attack (TIA), and cerebral infarction without residual deficits: Secondary | ICD-10-CM | POA: Insufficient documentation

## 2014-11-12 DIAGNOSIS — R200 Anesthesia of skin: Secondary | ICD-10-CM | POA: Insufficient documentation

## 2014-11-13 ENCOUNTER — Telehealth: Payer: Self-pay | Admitting: Family Medicine

## 2014-11-13 NOTE — Telephone Encounter (Signed)
Attempted to call the patient using the language line. Patient was not home. I asked that she call back to the clinic for her results. 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 We will need to obtain labs on her which she will need to come in to the office for. She will also need to be started on aspirin and a statin. We will need to consider completing the stroke work up with an echo, MRA, and carotid dopplers.

## 2014-11-15 ENCOUNTER — Encounter: Payer: Self-pay | Admitting: Family Medicine

## 2014-11-15 ENCOUNTER — Telehealth: Payer: Self-pay | Admitting: Family Medicine

## 2014-11-15 DIAGNOSIS — I639 Cerebral infarction, unspecified: Secondary | ICD-10-CM | POA: Insufficient documentation

## 2014-11-15 MED ORDER — ASPIRIN EC 81 MG PO TBEC: 81.0000 mg | DELAYED_RELEASE_TABLET | Freq: Every day | ORAL | Status: DC

## 2014-11-15 MED ORDER — ATORVASTATIN CALCIUM 40 MG PO TABS: 40.0000 mg | ORAL_TABLET | Freq: Every day | ORAL | Status: DC

## 2014-11-15 NOTE — Telephone Encounter (Signed)
Attempted to call the patient for a second time using [employer] to advise of the findings of the MRI of her brain. Will send letter to patient asking that she call the office for the results. Will need further CVA work up and will go ahead and send in statin and aspirin 81 mg. Order placed for echo and carotid dopplers. Orders placed for lipid panel, TSH, and A1c as future orders. Will ask that nursing schedules these studies after the patient calls back for the results.

## 2014-11-26 ENCOUNTER — Ambulatory Visit: Payer: Self-pay | Admitting: Family Medicine

## 2014-12-11 ENCOUNTER — Encounter: Payer: Self-pay | Admitting: Family Medicine

## 2014-12-11 ENCOUNTER — Ambulatory Visit (INDEPENDENT_AMBULATORY_CARE_PROVIDER_SITE_OTHER): Payer: Self-pay | Admitting: Family Medicine

## 2014-12-11 VITALS — BP 105/69 | HR 69 | Temp 97.9°F | Ht 60.0 in | Wt 142.0 lb

## 2014-12-11 DIAGNOSIS — M549 Dorsalgia, unspecified: Secondary | ICD-10-CM

## 2014-12-11 DIAGNOSIS — I639 Cerebral infarction, unspecified: Secondary | ICD-10-CM

## 2014-12-11 DIAGNOSIS — H538 Other visual disturbances: Secondary | ICD-10-CM

## 2014-12-11 LAB — COMPREHENSIVE METABOLIC PANEL
ALK PHOS: 54 U/L (ref 39–117)
ALT: <8 U/L (ref 0–35)
AST: 11 U/L (ref 0–37)
Albumin: 3.9 g/dL (ref 3.5–5.2)
BILIRUBIN TOTAL: 0.5 mg/dL (ref 0.2–1.2)
BUN: 11 mg/dL (ref 6–23)
CO2: 24 meq/L (ref 19–32)
CREATININE: 0.44 mg/dL — AB (ref 0.50–1.10)
Calcium: 9.1 mg/dL (ref 8.4–10.5)
Chloride: 103 mEq/L (ref 96–112)
GLUCOSE: 92 mg/dL (ref 70–99)
POTASSIUM: 4.5 meq/L (ref 3.5–5.3)
Sodium: 138 mEq/L (ref 135–145)
Total Protein: 6.8 g/dL (ref 6.0–8.3)

## 2014-12-11 LAB — CBC
HEMATOCRIT: 41.5 % (ref 36.0–46.0)
Hemoglobin: 13.4 g/dL (ref 12.0–15.0)
MCH: 27.4 pg (ref 26.0–34.0)
MCHC: 32.3 g/dL (ref 30.0–36.0)
MCV: 84.9 fL (ref 78.0–100.0)
MPV: 10.4 fL (ref 8.6–12.4)
PLATELETS: 244 10*3/uL (ref 150–400)
RBC: 4.89 MIL/uL (ref 3.87–5.11)
RDW: 13.2 % (ref 11.5–15.5)
WBC: 7.5 10*3/uL (ref 4.0–10.5)

## 2014-12-11 LAB — LIPID PANEL
Cholesterol: 146 mg/dL (ref 0–200)
HDL: 43 mg/dL — AB (ref >=46)
LDL CALC: 77 mg/dL (ref 0–99)
TRIGLYCERIDES: 130 mg/dL (ref <150)
Total CHOL/HDL Ratio: 3.4 Ratio
VLDL: 26 mg/dL (ref 0–40)

## 2014-12-11 LAB — POCT GLYCOSYLATED HEMOGLOBIN (HGB A1C): Hemoglobin A1C: 5.4

## 2014-12-11 LAB — TSH: TSH: 1.702 u[IU]/mL (ref 0.350–4.500)

## 2014-12-11 MED ORDER — ASPIRIN EC 81 MG PO TBEC: 81.0000 mg | DELAYED_RELEASE_TABLET | Freq: Every day | ORAL | Status: DC

## 2014-12-11 MED ORDER — GABAPENTIN 300 MG PO CAPS: 300.0000 mg | ORAL_CAPSULE | Freq: Three times a day (TID) | ORAL | Status: DC

## 2014-12-11 MED ORDER — ATORVASTATIN CALCIUM 40 MG PO TABS: 40.0000 mg | ORAL_TABLET | Freq: Every day | ORAL | Status: DC

## 2014-12-11 NOTE — Assessment & Plan Note (Signed)
Back pain continues. She reports some relief with gabapentin 100 mg, I will go ahead and increase this to 300 mg 3 times a day. We can discuss how this is doing when she follows up with me in 3 weeks.

## 2014-12-11 NOTE — Progress Notes (Signed)
Patient ID: Kristine Hayden, female   DOB: Jan 29, 1976, 39 y.o.   MRN: [id number]  Spanish interpreter utilized during this visit  HPI:  Back pain: Continues to have back pain. She had some relief with gabapentin 100 mg 3 times a day. The pain is located in her left upper back.  MRI results: She has not been informed of her MRI results which showed an old remote lacunar infarcts on the right side. This possibly explains the numbness she's had in her left arm and leg. She reports continued tingling and discomfort on her leg and arm, and also on her foot. She also has some left-sided numbness of her face and mouth. It is tingling and sharp. She does not smoke. She has a history of a tubal ligation. She does report a history of intermittent blurry vision. This affects both eyes. Sometimes sunlight bothers her.  ROS: See HPI.  PMFSH: History of microscopic hematuria, chronic back pain   PHYSICAL EXAM: BP 105/69 mmHg  Pulse 69  Temp(Src) 97.9 F (36.6 C) (Oral)  Ht 5' (1.524 m)  Wt 142 lb (64.411 kg)  BMI 27.73 kg/m2  LMP 11/27/2014  Visual acuity: Right 20/20, left 20/25, both 20/25 Gen: No acute distress, pleasant, cooperative HEENT: Normocephalic, atraumatic. Pupils equal round and reactive to light, no perilimbal redness. Vision grossly intact. Visual acuity as above. Heart: Regular rate and rhythm, no murmurs Lungs: Clear to auscultation bilaterally, normal respiratory effort Neuro: Speech normal, gait normal, alert and oriented 3. Only appreciable neurological deficit is decreased sensation over the left face, left arm, and left leg. Patient is able to feel these, but it feels tingling. Ext: No edema  ASSESSMENT/PLAN:  CVA (cerebral infarction) Discussed MRI results with patient. She has had a remote undetected stroke. This could potentially explain her history of left-sided numbness. She'll need a stroke workup. Plan: -Start aspirin 81 mg daily, and atorvastatin 40  mg daily -Check lab work today: CMET, lipids, TSH, A1c -Echo and carotid Dopplers -Refer to ophthalmology for intermittent blurry vision. -Follow-up in 3 weeks in office to discuss these results. I informed patient that we would only call her if something was grossly abnormal, otherwise will discuss them at her follow-up visit.   Back pain Back pain continues. She reports some relief with gabapentin 100 mg, I will go ahead and increase this to 300 mg 3 times a day. We can discuss how this is doing when she follows up with me in 3 weeks.    FOLLOW UP: F/u in 3 weeks for discussion of results as above.  GrenadaBrittany J. Pollie MeyerMcIntyre, MD [facility]

## 2014-12-11 NOTE — Patient Instructions (Addendum)
Increase gabapentin to 300mg  three times a day. Use caution as this might make you sleepy.  We are checking tests: bloodwork and ultrasounds of heart and neck arteries. I am referring you to an eye doctor for your eyes. You will get a phone call to schedule this appointment.   Schedule an appointment to come back and see me in 3 weeks to go over all your tests  Start aspirin 81mg  daily and lipitor 40mg  daily. I sent these in for you.  Be well, Dr. Pollie MeyerMcIntyre

## 2014-12-11 NOTE — Assessment & Plan Note (Addendum)
Discussed MRI results with patient. She has had a remote undetected stroke. This could potentially explain her history of left-sided numbness. She'll need a stroke workup. Plan: -Start aspirin 81 mg daily, and atorvastatin 40 mg daily -Check lab work today: CMET, lipids, TSH, A1c -Echo and carotid Dopplers -Refer to ophthalmology for intermittent blurry vision. -Follow-up in 3 weeks in office to discuss these results. I informed patient that we would only call her if something was grossly abnormal, otherwise will discuss them at her follow-up visit.

## 2014-12-16 ENCOUNTER — Ambulatory Visit (HOSPITAL_COMMUNITY)
Admission: RE | Admit: 2014-12-16 | Discharge: 2014-12-16 | Disposition: A | Discharge status: Home / Self Care | Payer: No Typology Code available for payment source | Source: Ambulatory Visit | Attending: Internal Medicine | Admitting: Internal Medicine

## 2014-12-16 ENCOUNTER — Ambulatory Visit (HOSPITAL_COMMUNITY)
Admission: RE | Admit: 2014-12-16 | Discharge: 2014-12-16 | Disposition: A | Discharge status: Home / Self Care | Payer: No Typology Code available for payment source | Source: Ambulatory Visit | Attending: Family Medicine | Admitting: Family Medicine

## 2014-12-16 DIAGNOSIS — I639 Cerebral infarction, unspecified: Secondary | ICD-10-CM

## 2014-12-16 DIAGNOSIS — R200 Anesthesia of skin: Secondary | ICD-10-CM | POA: Insufficient documentation

## 2014-12-16 DIAGNOSIS — I6523 Occlusion and stenosis of bilateral carotid arteries: Secondary | ICD-10-CM | POA: Insufficient documentation

## 2014-12-16 DIAGNOSIS — Z8673 Personal history of transient ischemic attack (TIA), and cerebral infarction without residual deficits: Secondary | ICD-10-CM | POA: Insufficient documentation

## 2014-12-16 DIAGNOSIS — I6789 Other cerebrovascular disease: Secondary | ICD-10-CM

## 2014-12-16 NOTE — Progress Notes (Signed)
  Echocardiogram 2D Echocardiogram has been performed.  Asuncion Tapscott FRANCES 12/16/2014, 9:58 AM

## 2014-12-16 NOTE — Progress Notes (Signed)
VASCULAR LAB PRELIMINARY  PRELIMINARY  PRELIMINARY  PRELIMINARY  Carotid Dopplers completed.    Preliminary report:  1-39% ICA stenosis. Vertebral artery flow is antegrade.   Kristine Hayden, RVT 12/16/2014, 1:40 PM

## 2014-12-25 ENCOUNTER — Other Ambulatory Visit: Payer: Self-pay | Admitting: Urology

## 2014-12-25 DIAGNOSIS — N361 Urethral diverticulum: Secondary | ICD-10-CM

## 2014-12-27 ENCOUNTER — Telehealth: Payer: Self-pay | Admitting: Urology

## 2014-12-27 NOTE — Telephone Encounter (Signed)
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 Placed on suppressive ppx bactrim.  Plan for pelvic MRI for diagnosis and surgical treatment if diverticulum present.

## 2015-01-01 ENCOUNTER — Ambulatory Visit (INDEPENDENT_AMBULATORY_CARE_PROVIDER_SITE_OTHER): Payer: No Typology Code available for payment source | Admitting: Family Medicine

## 2015-01-01 ENCOUNTER — Encounter: Payer: Self-pay | Admitting: Family Medicine

## 2015-01-01 VITALS — BP 113/72 | HR 60 | Temp 97.5°F | Ht 60.0 in | Wt 139.0 lb

## 2015-01-01 DIAGNOSIS — M549 Dorsalgia, unspecified: Secondary | ICD-10-CM

## 2015-01-01 DIAGNOSIS — I639 Cerebral infarction, unspecified: Secondary | ICD-10-CM

## 2015-01-01 DIAGNOSIS — R3129 Other microscopic hematuria: Secondary | ICD-10-CM

## 2015-01-01 DIAGNOSIS — R312 Other microscopic hematuria: Secondary | ICD-10-CM

## 2015-01-01 MED ORDER — AMITRIPTYLINE HCL 25 MG PO TABS: 25.0000 mg | ORAL_TABLET | Freq: Every day | ORAL | Status: DC

## 2015-01-01 NOTE — Assessment & Plan Note (Signed)
Has been seen by urology. Had cystoscopy done. Pelvic MRI is pending to evaluate possible mass in her bladder. We'll await any records from urology, as they're actively working this up right now.

## 2015-01-01 NOTE — Progress Notes (Signed)
Patient ID: Kristine Hayden, female   DOB: [birth year], 39 y.o.   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 Lab work was also unremarkable. She is on aspirin 81 mg daily and Lipitor 40 mg daily. She is agreeable to seeing a neurologist. She continues to have numbness in the feeling of pain scrolling on the left side of her head. Also has this sensation in her back.  Back pain: No improvement with increased dose of gabapentin. She does not think this medicine helped her, actually thinks it made her symptoms worse. She has sensation of numbness and tingling in an scrolling on her thoracic back. Also has radiating pain down her left leg. This is chronic pain, not worsened in general over the last few years. This all began after she fell a few years ago. She is in the interim had MRIs of her lumbar and cervical spines which were unremarkable for any neural compression. No fevers or difficulty with stooling.  Hematuria: Has followed with urology. Had cystoscopy done. They noted a mass in her bladder apparently. She has an MRI of her pelvis ordered. She is unsure right now she'll need surgery.  ROS: See HPI.  PMFSH: Chronic back pain, stroke of unknown etiology, chronic hematuria  PHYSICAL EXAM: BP 113/72 mmHg  Pulse 60  Temp(Src) 97.5 F (36.4 C) (Oral)  Ht 5' (1.524 m)  Wt 139 lb (63.05 kg)  BMI 27.15 kg/m2  LMP 12/30/2014 Gen: No acute distress, pleasant, cooperative HEENT: Face symmetric, normocephalic, atraumatic, extraocular movements intact, no neck stiffness Heart: Regular rate and rhythm, no murmurs Lungs: Clear to auscultation bilaterally, normal respiratory effort Neuro: Grossly nonfocal, speech normal, full strength bilateral lower extremities Ext: No edema bilateral lower extremities  ASSESSMENT/PLAN:  Microscopic hematuria Has been seen by urology. Had cystoscopy done. Pelvic MRI is pending  to evaluate possible mass in her bladder. We'll await any records from urology, as they're actively working this up right now.   CVA (cerebral infarction) No explanation for stroke seen on workup. I will refer her to neurology for further evaluation. In the meantime, she'll continue aspirin 81 mg daily and Lipitor 40 mg daily.   Back pain Chronic in nature. No red flags. No relief with increased dose of gabapentin. Instructed patient to stop this medicine. We will start low-dose amitriptyline at night see if this gives her any relief. Follow-up in 6 weeks to reevaluate.    FOLLOW UP: F/u in 6 weeks for back pain Continue to follow with urology Referring to neurology.  Kristine J. Pollie MeyerMcIntyre, MD [facility]

## 2015-01-01 NOTE — Patient Instructions (Signed)
Referring to neurologist Start amitriptyline for your back, one pill at night Follow up with me in 6 weeks or sooner if you have any new problems  Be well, Dr. Pollie MeyerMcIntyre

## 2015-01-01 NOTE — Assessment & Plan Note (Signed)
No explanation for stroke seen on workup. I will refer her to neurology for further evaluation. In the meantime, she'll continue aspirin 81 mg daily and Lipitor 40 mg daily.

## 2015-01-01 NOTE — Assessment & Plan Note (Signed)
Chronic in nature. No red flags. No relief with increased dose of gabapentin. Instructed patient to stop this medicine. We will start low-dose amitriptyline at night see if this gives her any relief. Follow-up in 6 weeks to reevaluate.

## 2015-01-07 ENCOUNTER — Other Ambulatory Visit (HOSPITAL_COMMUNITY): Payer: No Typology Code available for payment source

## 2015-01-08 ENCOUNTER — Ambulatory Visit (HOSPITAL_COMMUNITY)
Admission: RE | Admit: 2015-01-08 | Discharge: 2015-01-08 | Disposition: A | Discharge status: Home / Self Care | Payer: No Typology Code available for payment source | Source: Ambulatory Visit | Attending: Urology | Admitting: Urology

## 2015-01-08 DIAGNOSIS — N361 Urethral diverticulum: Secondary | ICD-10-CM

## 2015-01-08 DIAGNOSIS — Z8619 Personal history of other infectious and parasitic diseases: Secondary | ICD-10-CM | POA: Insufficient documentation

## 2015-01-08 MED ORDER — GADOBENATE DIMEGLUMINE 529 MG/ML IV SOLN
15.0000 mL | Freq: Once | INTRAVENOUS | Status: AC | PRN
  Administered 2015-01-08: 13 mL via INTRAVENOUS

## 2015-02-11 ENCOUNTER — Ambulatory Visit (INDEPENDENT_AMBULATORY_CARE_PROVIDER_SITE_OTHER): Payer: No Typology Code available for payment source | Admitting: Family Medicine

## 2015-02-11 ENCOUNTER — Encounter: Payer: Self-pay | Admitting: Family Medicine

## 2015-02-11 VITALS — BP 99/65 | HR 69 | Temp 98.6°F | Ht 60.0 in | Wt 140.1 lb

## 2015-02-11 DIAGNOSIS — M549 Dorsalgia, unspecified: Secondary | ICD-10-CM

## 2015-02-11 DIAGNOSIS — M79645 Pain in left finger(s): Secondary | ICD-10-CM

## 2015-02-11 DIAGNOSIS — R200 Anesthesia of skin: Secondary | ICD-10-CM

## 2015-02-11 DIAGNOSIS — R202 Paresthesia of skin: Secondary | ICD-10-CM

## 2015-02-11 DIAGNOSIS — I639 Cerebral infarction, unspecified: Secondary | ICD-10-CM

## 2015-02-11 MED ORDER — WRIST SPLINT/COCK-UP/LEFT M MISC: Status: DC

## 2015-02-11 MED ORDER — PREDNISONE 20 MG PO TABS: 40.0000 mg | ORAL_TABLET | Freq: Every day | ORAL | Status: DC

## 2015-02-11 MED ORDER — AMITRIPTYLINE HCL 50 MG PO TABS: 50.0000 mg | ORAL_TABLET | Freq: Every day | ORAL | Status: DC

## 2015-02-11 NOTE — Progress Notes (Signed)
Patient ID: Kristine EulerMaria Rosa Martinez-Encarnacion, female   DOB: 1976/08/19, 39 y.o.   MRN: [id number]  Spanish interpreter utilized during today's visit.  HPI:  Back pain: has not had much improvement with addition of amitriptyline. Denies having fever, saddle anesthesia, lower extremity weakness, or problems with stooling or urination. Feels like pain is flaring recently.   L fourth finger pain: has had pain in this finger. Also has pain in L wrist going up toward elbow, and toward other fingers. This has been ongoing pain for several years but the finger pain is new. Painful to palpation.  Stroke: still hasn't heard about neurology appointment. Has not been taking statin or aspirin as instructed but is amenable to restarting these.   ROS: See HPI.  PMFSH: hx microscopic hematuria (undergoing w/u by urology), chronic back pain, CVA  PHYSICAL EXAM: BP 99/65 mmHg  Pulse 69  Temp(Src) 98.6 F (37 C) (Oral)  Ht 5' (1.524 m)  Wt 140 lb 1.6 oz (63.549 kg)  BMI 27.36 kg/m2  LMP 02/10/2015 Gen: NAD HEENT: NCAT. Full ROM of neck Heart: RRR Lungs: CTAB NWOB Neuro: grossly nonfocal speech normal Ext: L fourth finger without any erythema, swelling, or trauma but is tender to palpation. 2+ radial pulse on L. Full grip strength bilat. Full strength bilat upper extremities. +phalens, negative tinels Back: musculature of upper back TTP  ASSESSMENT/PLAN:  Back pain No red flags. Chronic in nature with recent flare.  -prednisone daily x 5 days -increase amitriptyline to 50mg  QHS -add heating pad -f/u 6 weeks   Numbness and tingling Pain in L wrist possibly represents carpal tunnel syndrome.  -increasing amitriptyline as already mentioned -cockup splint nightly x 6 weeks   CVA (cerebral infarction) I have asked nursing staff to check into status of neurology referral Restart aspirin and statin Bring all meds to next visit to review reason for being on them   Pain of finger of left  hand Seems a separate issue from L wrist pain. Finger TTP but no obvious infection or other emergent condition. -xray L fourth finger    FOLLOW UP: F/u in 6 weeks for above issues  GrenadaBrittany J. Pollie MeyerMcIntyre, MD [facility]

## 2015-02-11 NOTE — Patient Instructions (Signed)
Wear wrist splint nightly for 6 weeks Checking xray of finger  Prednisone 40mg  daily for 5 days Increase amitryptiline dose to 50mg  daily  Will check on status of neurology referral Bring all medicines to next appointment Follow up with me in 6 weeks  Be well, Dr. Pollie MeyerMcIntyre

## 2015-02-13 DIAGNOSIS — M79645 Pain in left finger(s): Secondary | ICD-10-CM | POA: Insufficient documentation

## 2015-02-13 NOTE — Assessment & Plan Note (Signed)
No red flags. Chronic in nature with recent flare.  -prednisone daily x 5 days -increase amitriptyline to 50mg  QHS -add heating pad -f/u 6 weeks

## 2015-02-13 NOTE — Assessment & Plan Note (Signed)
I have asked nursing staff to check into status of neurology referral Restart aspirin and statin Bring all meds to next visit to review reason for being on them

## 2015-02-13 NOTE — Assessment & Plan Note (Signed)
Seems a separate issue from L wrist pain. Finger TTP but no obvious infection or other emergent condition. -xray L fourth finger

## 2015-02-13 NOTE — Assessment & Plan Note (Signed)
Pain in L wrist possibly represents carpal tunnel syndrome.  -increasing amitriptyline as already mentioned -cockup splint nightly x 6 weeks

## 2015-03-13 ENCOUNTER — Ambulatory Visit (HOSPITAL_COMMUNITY)
Admission: RE | Admit: 2015-03-13 | Discharge: 2015-03-13 | Disposition: A | Discharge status: Home / Self Care | Payer: No Typology Code available for payment source | Source: Ambulatory Visit | Attending: Family Medicine | Admitting: Family Medicine

## 2015-03-13 DIAGNOSIS — M79645 Pain in left finger(s): Secondary | ICD-10-CM | POA: Insufficient documentation

## 2015-03-14 ENCOUNTER — Encounter: Payer: Self-pay | Admitting: Family Medicine

## 2015-03-24 ENCOUNTER — Ambulatory Visit: Payer: No Typology Code available for payment source | Admitting: Family Medicine

## 2015-08-21 ENCOUNTER — Other Ambulatory Visit: Payer: Self-pay | Admitting: Family Medicine

## 2015-08-21 ENCOUNTER — Ambulatory Visit
Admission: RE | Admit: 2015-08-21 | Discharge: 2015-08-21 | Disposition: A | Discharge status: Home / Self Care | Payer: Self-pay | Source: Ambulatory Visit | Attending: Family Medicine | Admitting: Family Medicine

## 2015-08-21 DIAGNOSIS — R52 Pain, unspecified: Secondary | ICD-10-CM

## 2016-02-26 ENCOUNTER — Encounter: Payer: Self-pay | Admitting: Family Medicine

## 2016-02-26 ENCOUNTER — Ambulatory Visit (INDEPENDENT_AMBULATORY_CARE_PROVIDER_SITE_OTHER): Payer: Self-pay | Admitting: Family Medicine

## 2016-02-26 VITALS — BP 112/58 | HR 107 | Temp 97.6°F

## 2016-02-26 DIAGNOSIS — N9089 Other specified noninflammatory disorders of vulva and perineum: Secondary | ICD-10-CM

## 2016-02-26 DIAGNOSIS — R300 Dysuria: Secondary | ICD-10-CM

## 2016-02-26 LAB — POCT URINALYSIS DIPSTICK
BILIRUBIN UA: NEGATIVE
Glucose, UA: NEGATIVE
Ketones, UA: NEGATIVE
Nitrite, UA: NEGATIVE
Protein, UA: NEGATIVE
SPEC GRAV UA: 1.02
Urobilinogen, UA: 0.2
pH, UA: 6.5

## 2016-02-26 LAB — POCT UA - MICROSCOPIC ONLY

## 2016-02-26 LAB — POCT URINE PREGNANCY: PREG TEST UR: NEGATIVE

## 2016-02-26 MED ORDER — CEPHALEXIN 500 MG PO CAPS: 500.0000 mg | ORAL_CAPSULE | Freq: Two times a day (BID) | ORAL | Status: DC

## 2016-02-26 NOTE — Patient Instructions (Signed)
Sent in antibiotic for your urine  You are due for a pap smear Please schedule an appointment for this in July with your new doctor  If you are having issues with your breasts, etc before that, can schedule a separate visit to discuss.  I will refer you to see a gynecologist for the spot that's causing you pain  Be well, Dr. Esmond Camper Erick Alley  (Urinary Tract Infection)  La infeccin urinaria puede ocurrir en cualquier lugar del tracto urinario. El tracto urinario es un sistema de drenaje del cuerpo por el que se eliminan los desechos y el exceso de [place]. El tracto urinario est formado por dos riones, dos urteres, la vejiga y Engineer, mining. Los riones son rganos que tienen forma de frijol. Cada rin tiene aproximadamente el tamao del puo. Estn situados debajo de las [place], uno a cada lado de la columna vertebral CAUSAS  La causa de la infeccin son los microbios, que son organismos microscpicos, que incluyen hongos, virus, y bacterias. Estos organismos son tan pequeos que slo pueden verse a travs del microscopio. Las bacterias son los microorganismos que ms comnmente causan infecciones urinarias.  SNTOMAS  Los sntomas pueden variar segn la edad y el sexo del paciente y por la ubicacin de la infeccin. Los sntomas en las mujeres jvenes incluyen la necesidad frecuente e intensa de orinar y una sensacin dolorosa de ardor en la vejiga o en la uretra durante la miccin. Las mujeres y los hombres mayores podrn sentir cansancio, temblores y debilidad y Futures trader musculares y Engineer, mining abdominal. Si tiene [place], puede significar que la infeccin est en los riones. Otros sntomas son dolor en la espalda o en los lados debajo de las [place], nuseas y vmitos.  DIAGNSTICO  Para diagnosticar una infeccin urinaria, el mdico le preguntar acerca de sus sntomas. [employer] una [place]. La muestra de orina se analiza para Engineer, manufacturing bacterias y  glbulos blancos de Risk manager. Los glbulos blancos se forman en el organismo para ayudar a Artist las infecciones.  TRATAMIENTO  Por lo general, las infecciones urinarias pueden tratarse con medicamentos. Debido a que la [employer] de las infecciones son causadas por bacterias, por lo general pueden tratarse con antibiticos. La eleccin del antibitico y la duracin del tratamiento depender de sus sntomas y el tipo de bacteria causante de la infeccin.  INSTRUCCIONES PARA EL CUIDADO EN EL HOGAR   Si le recetaron antibiticos, tmelos exactamente como su mdico le indique. Termine el medicamento aunque se sienta mejor despus de haber tomado slo algunos.  Beba gran cantidad de lquido para mantener la orina de tono claro o color amarillo plido.  Evite la cafena, el t y las [address]. Estas sustancias irritan la vejiga.  Vaciar la vejiga con frecuencia. Evite retener la orina durante largos perodos.  Vace la vejiga antes y despus de Management consultant.  Despus de mover el intestino, las mujeres deben higienizarse la regin perineal desde adelante hacia atrs. Use slo un papel tissue por vez. SOLICITE ATENCIN MDICA SI:   Siente dolor en la espalda.  Le sube la fiebre.  Los sntomas no mejoran luego de [address]. SOLICITE ATENCIN MDICA DE INMEDIATO SI:   Siente dolor intenso en la espalda o en la zona inferior del abdomen.  Comienza a sentir escalofros.  Tiene nuseas o vmitos.  Tiene una sensacin continua de quemazn o molestias al [employer]. ASEGRESE DE QUE:   Comprende estas instrucciones.  Controlar su enfermedad.  Solicitar  ayuda de inmediato si no mejora o empeora.   Esta informacin no tiene Theme park managercomo fin reemplazar el consejo del mdico. Asegrese de hacerle al mdico cualquier pregunta que tenga.   Document Released: 06/30/2005 Document Revised: 06/14/2012 Elsevier Interactive Patient Education [employer]2016 Elsevier Inc.

## 2016-02-26 NOTE — Progress Notes (Signed)
Date of Visit: 02/26/2016   HPI:  Patient presents for a same day appointment to discuss possible UTI. Spanish interpreter utilized during today's visit.   For 3 weeks has had urinary frequency and burning. No fevers but feels cold/chills. Drinking well. Also endorses some urinary hesitancy. No vomiting.   She has a history of microscopic hematuria and previously saw urology twice. Has not renewed her orange card yet and thus has not been seen there again despite them wanting her to follow up.  She also feels she has "blisters" in her genital area and inside her bladder. Has itching in the area but feels she can't touch it. This is not new, present since last year.  Also reports pain in her head, a pulsing pain and cramping in her calves. Also having issues with her breasts. Wants to know if she needs a pap smear.   ROS: See HPI  PMFSH: history of CVA, microscopic hematuria  PHYSICAL EXAM: BP 112/58 mmHg  Pulse 107  Temp(Src) 97.6 F (36.4 C) (Other (Comment)) Gen: NAD, pleasant, cooperative HEENT: normocephalic, atraumatic, moist mucous membranes  Heart: regular rate and rhythm, no murmur Lungs: clear to auscultation bilaterally, normal work of breathing  Abdomen: soft, nontender to palpation GU: external genitalia normal without lesions. Area patient points to as being painful is her clitoris. Palpation of the clitoral area reveals a mobile structure beneath clitoris that she identifies as the source of her pain. No vulvar lesions or masses.  Neuro: alert, grossly nonfocal, speech normal  ASSESSMENT/PLAN:  1. UTI - symptoms consistent with likely UTI. Moderate blood and trace leuks on UA though micro not overtly suggestive of infection. Will send urine for culture and treat empirically with keflex. Instructed patient to work on renewing orange card so that she can be seen by urology again.  2. Pain in clitoris - has been evaluated previously by GYN. I do feel a structure beneath  her clitoris that she identifies as being painful, though I'm unsure if this is normal anatomy. Will refer back to GYN for another exam  Other non-urgent concerns - breast pain, headache, cramping in calves, pap smear etc instructed to follow up to meet new PCP.  FOLLOW UP: Follow up in July to meet new PCP, sooner if needed Referring to GYN Get orange card to follow up with urology  GrenadaBrittany J. Pollie MeyerMcIntyre, MD [facility]

## 2016-02-28 LAB — URINE CULTURE: Colony Count: >=100000

## 2016-03-04 ENCOUNTER — Ambulatory Visit: Payer: Self-pay

## 2016-03-06 ENCOUNTER — Ambulatory Visit (HOSPITAL_COMMUNITY)
Admission: EM | Admit: 2016-03-06 | Discharge: 2016-03-06 | Disposition: A | Discharge status: Home / Self Care | Payer: Self-pay | Attending: Family Medicine | Admitting: Family Medicine

## 2016-03-06 ENCOUNTER — Encounter (HOSPITAL_COMMUNITY): Payer: Self-pay | Admitting: Emergency Medicine

## 2016-03-06 DIAGNOSIS — G44209 Tension-type headache, unspecified, not intractable: Secondary | ICD-10-CM

## 2016-03-06 LAB — POCT URINALYSIS DIP (DEVICE)
Bilirubin Urine: NEGATIVE
Glucose, UA: NEGATIVE mg/dL
KETONES UR: NEGATIVE mg/dL
LEUKOCYTES UA: NEGATIVE
Nitrite: NEGATIVE
PROTEIN: 30 mg/dL — AB
Urobilinogen, UA: 0.2 mg/dL (ref 0.0–1.0)
pH: 6 (ref 5.0–8.0)

## 2016-03-06 MED ORDER — TRAMADOL HCL 50 MG PO TABS: 50.0000 mg | ORAL_TABLET | Freq: Four times a day (QID) | ORAL | Status: DC | PRN

## 2016-03-06 MED ORDER — CYCLOBENZAPRINE HCL 10 MG PO TABS: 10.0000 mg | ORAL_TABLET | Freq: Two times a day (BID) | ORAL | Status: DC | PRN

## 2016-03-06 MED ORDER — KETOROLAC TROMETHAMINE 30 MG/ML IJ SOLN
INTRAMUSCULAR | Status: AC
  Filled 2016-03-06: qty 1

## 2016-03-06 MED ORDER — KETOROLAC TROMETHAMINE 30 MG/ML IJ SOLN
30.0000 mg | Freq: Once | INTRAMUSCULAR | Status: AC
  Administered 2016-03-06: 30 mg via INTRAMUSCULAR

## 2016-03-06 NOTE — ED Provider Notes (Signed)
CSN: [id number]     Arrival date & time 03/06/16  1447 History   First MD Initiated Contact with Patient 03/06/16 1545     Chief Complaint  Patient presents with  . Headache  . Back Pain   Patient is a 40 y.o. female presenting with headaches and back pain. The history is provided by the patient.  Headache Pain location:  Occipital Quality:  Dull Radiates to:  R neck, L neck and upper back Severity currently:  7/10 Onset quality:  Gradual Duration:  4 weeks Timing:  Intermittent Progression:  Unchanged Chronicity:  New Similar to prior headaches: yes   Context: not activity, not exposure to bright light, not caffeine, not coughing, not defecating, not eating, not stress, not exposure to cold air, not intercourse, not loud noise and not straining   Relieved by:  Nothing Worsened by:  Neck movement Associated symptoms: abdominal pain, back pain, neck pain, neck stiffness, numbness and tingling   Associated symptoms: no blurred vision, no congestion, no cough, no diarrhea, no dizziness, no eye pain, no facial pain, no fatigue, no fever, no focal weakness, no hearing loss, no near-syncope, no paresthesias, no photophobia, no seizures, no sore throat, no swollen glands, no syncope, no URI, no visual change and no weakness   Back Pain Associated symptoms: abdominal pain, headaches and numbness   Associated symptoms: no dysuria, no fever, no paresthesias and no weakness   Pt reports intermittent occiput lobe area h/a's that radiate into her neck and upper back. At times she notices brief numbness and tingling sensations to bil arms. Admits to some heavy lifting at work.  Urinary frequency: Pt also reports mild intermittent abd pain and urinary frequency. No abd  Pain at this time.   History reviewed. No pertinent past medical history. Past Surgical History  Procedure Laterality Date  . Cesarean section     History reviewed. No pertinent family history. Social History  Substance Use  Topics  . Smoking status: Never Smoker   . Smokeless tobacco: None  . Alcohol Use: No   OB History    Gravida Para Term Preterm AB TAB SAB Ectopic Multiple Living   Review of Systems  Constitutional: Negative for fever, activity change, appetite change and fatigue.  HENT: Negative for congestion, hearing loss and sore throat.   Eyes: Negative.  Negative for blurred vision, photophobia, pain and visual disturbance.  Respiratory: Negative for cough.   Cardiovascular: Negative.  Negative for syncope and near-syncope.  Gastrointestinal: Positive for abdominal pain. Negative for diarrhea.  Genitourinary: Positive for frequency. Negative for dysuria and difficulty urinating.  Musculoskeletal: Positive for back pain, neck pain and neck stiffness.  Skin: Negative.   Allergic/Immunologic: Negative.   Neurological: Positive for numbness and headaches. Negative for dizziness, focal weakness, seizures, syncope, speech difficulty, weakness, light-headedness and paresthesias.  Psychiatric/Behavioral: Negative.     Allergies  Review of patient's allergies indicates no known allergies.  Home Medications   Prior to Admission medications   Medication Sig Start Date End Date Taking? Authorizing Provider  amitriptyline (ELAVIL) 50 MG tablet Take 1 tablet (50 mg total) by mouth at bedtime. 02/11/15   Latrelle Dodrill, MD  aspirin EC 81 MG tablet Take 1 tablet (81 mg total) by mouth daily. 12/11/14   Latrelle Dodrill, MD  atorvastatin (LIPITOR) 40 MG tablet Take 1 tablet (40 mg total) by mouth daily. 12/11/14   Estevan Ryder  Pollie MeyerMcIntyre, MD  cephALEXin (KEFLEX) 500 MG capsule Take 1 capsule (500 mg total) by mouth 2 (two) times daily. For 7 days 02/26/16   Latrelle DodrillBrittany J McIntyre, MD  citalopram (CELEXA) 40 MG tablet Take 40 mg by mouth. 07/01/14   Historical Provider, MD  cyclobenzaprine (FLEXERIL) 10 MG tablet Take 1 tablet (10 mg total) by mouth 2 (two) times daily as needed for muscle  spasms. 03/06/16   Leanne ChangKatherine P Schorr, NP  Elastic Bandages & Supports (WRIST SPLINT/COCK-UP/LEFT M) MISC Wear splint nightly for 6 weeks. Patient may choose size. 02/11/15   Latrelle DodrillBrittany J McIntyre, MD  gabapentin (NEURONTIN) 300 MG capsule Take 1 capsule (300 mg total) by mouth 3 (three) times daily. 12/11/14   Latrelle DodrillBrittany J McIntyre, MD  HYDROcodone-acetaminophen (NORCO/VICODIN) 5-325 MG per tablet Take 1-2 tablets by mouth every 6 (six) hours as needed for moderate pain. 07/18/14   Vanetta MuldersScott Zackowski, MD  ibuprofen (ADVIL,MOTRIN) 800 MG tablet Take 1 tablet (800 mg total) by mouth every 8 (eight) hours as needed. 08/27/14   John C Pick-Jacobs, DO  predniSONE (DELTASONE) 20 MG tablet Take 2 tablets (40 mg total) by mouth daily. 02/11/15   Latrelle DodrillBrittany J McIntyre, MD  ranitidine (ZANTAC) 150 MG tablet Take 1 tablet (150 mg total) by mouth 2 (two) times daily. 10/14/14   Latrelle DodrillBrittany J McIntyre, MD  traMADol (ULTRAM) 50 MG tablet Take 1 tablet (50 mg total) by mouth every 6 (six) hours as needed. 03/06/16   Leanne ChangKatherine P Schorr, NP   Meds Ordered and Administered this Visit   Medications  ketorolac (TORADOL) 30 MG/ML injection 30 mg (30 mg Intramuscular Given 03/06/16 1643)    BP 112/69 mmHg  Pulse 71  Temp(Src) 98.5 F (36.9 C) (Oral)  Resp 18  SpO2 96%  LMP 03/06/2016 No data found.   Physical Exam  Constitutional: She is oriented to person, place, and time. She appears well-developed and well-nourished.  HENT:  Head: Normocephalic and atraumatic.  Eyes: Conjunctivae are normal.  Neck: Normal range of motion and full passive range of motion without pain. Neck supple. Muscular tenderness present. No spinous process tenderness present.  Cardiovascular: Normal rate.   Pulmonary/Chest: Effort normal.  Musculoskeletal: Normal range of motion.  Neurological: She is alert and oriented to person, place, and time. She has normal strength. No cranial nerve deficit. Coordination and gait normal.  Skin: Skin is warm and  dry.  Psychiatric: She has a normal mood and affect.    ED Course  Procedures (including critical care time)  Labs Review Labs Reviewed  POCT URINALYSIS DIP (DEVICE) - Abnormal; Notable for the following:    Hgb urine dipstick LARGE (*)    Protein, ur 30 (*)    All other components within normal limits    Imaging Review No results found.   Visual Acuity Review  Right Eye Distance:   Left Eye Distance:   Bilateral Distance:    Right Eye Near:   Left Eye Near:    Bilateral Near:         MDM   1. Muscle tension headache   Medicated w/ Toradol 30 mg IM in office. Flexeril for home use and Tramadol to use for pain at work. Recommend pt arrange f/u w/ ortho referral if symptoms persist or worsen.  Non-focal exam. U/A negative. Leanne ChangKatherine P Schorr, NP 03/06/16 1823  Roma KayserKatherine P Schorr, NP 03/06/16 [id number]

## 2016-03-06 NOTE — Discharge Instructions (Signed)
Use Flexeril when at home. Use Tramadol for pain when at work. Heating pad to neck and shoulders when at home. If symptoms persist, worsen or do not improve arrang follow up with the orthopedic Dr provided.  Cefalea tensional (Tension Headache) Una cefalea tensional es un dolor o presin que se siente en la frente y los lados de la cabeza. Estas cefaleas pueden durar de 30minutos a varios das. CUIDADOS EN EL HOGAR Control del W. R. Berkleydolor  Tome los medicamentos de venta libre y los recetados solamente como se lo haya indicado el mdico.  Cuando sienta dolor de cabeza acustese en un cuarto oscuro y tranquilo.  Si se lo indican, aplquese hielo en la zona de la cabeza y el cuello:  Ponga el hielo en una bolsa plstica.  Coloque una toalla entre la piel y la bolsa de hielo.  Coloque el hielo durante 20minutos, 2 a 3veces por Futures traderda.  Utilice una almohadilla trmica o tome una ducha caliente para aplicar calor en la zona de la cabeza y el cuello segn las indicaciones del mdico. Comida y bebida  Mantenga un horario para las comidas.  No beba mucho alcohol.  No consuma mucha cafena ni deje de consumirla por completo. Instrucciones generales  Concurra a todas las visitas de control como se lo haya indicado el mdico. Esto es importante.  Lleve un registro diario para Financial risk analystaveriguar si ciertas cosas provocan los dolores de Turkmenistancabeza. Por ejemplo, escriba los siguientes datos:  Lo que usted come y Estate agentbebe.  Cunto tiempo duerme.  Algn cambio en su dieta o en los medicamentos.  Pruebe recibir Customer service managerun masaje o hacer otras cosas que lo ayuden a Lexicographerrelajarse.  Disminuya el nivel de estrs.  Sintese con la espalda recta. No contraiga (tensione) los msculos.  No consuma productos que contengan tabaco. Estos incluyen cigarrillos, tabaco para mascar y Administrator, Civil Servicecigarrillos electrnicos. Si necesita ayuda para dejar de fumar, consulte al mdico.  Haga ejercicios con regularidad tal como se lo indic el  mdico.  Duerma lo suficiente. Es Designer, jewellerydecir, entre 7 y 9horas de sueo. SOLICITE AYUDA SI:  Los medicamentos no logran [employer]bury Automotive Groupaliviar los sntomas.  Tiene un dolor de cabeza que es diferente del dolor de cabeza habitual.  Tiene Dentistmalestar estomacal (nuseas) o vomita.  Tiene fiebre. SOLICITE AYUDA DE INMEDIATO SI:  La cefalea es muy intensa.  Sigue vomitando.  Presenta rigidez en el cuello.  Tiene dificultad para ver.  Tiene dificultad para hablar.  Siente dolor en el ojo o en el odo.  Sus msculos estn dbiles, o pierde el control muscular.  Pierde el equilibrio o tiene problemas para Advertising account plannercaminar.  Siente que se desvanece (pierde el conocimiento) o se desmaya.  Se siente confundido.   Esta informacin no tiene Theme park managercomo fin reemplazar el consejo del mdico. Asegrese de hacerle al mdico cualquier pregunta que tenga.   Document Released: 12/13/2011 Document Revised: 06/11/2015 Elsevier Interactive Patient Education [employer]2016 Elsevier Inc.

## 2016-03-06 NOTE — ED Notes (Signed)
Here for constant HA onset x1 month that radiates to upper back Also c/o abd pain associated w/urinary freq A&O x4... No acute distress.

## 2016-03-08 ENCOUNTER — Ambulatory Visit (HOSPITAL_COMMUNITY)
Admission: EM | Admit: 2016-03-08 | Discharge: 2016-03-08 | Disposition: A | Discharge status: Home / Self Care | Payer: Self-pay | Attending: Family Medicine | Admitting: Family Medicine

## 2016-03-08 ENCOUNTER — Encounter (HOSPITAL_COMMUNITY): Payer: Self-pay | Admitting: Family Medicine

## 2016-03-08 DIAGNOSIS — M79604 Pain in right leg: Secondary | ICD-10-CM

## 2016-03-08 DIAGNOSIS — M79605 Pain in left leg: Secondary | ICD-10-CM

## 2016-03-08 MED ORDER — NAPROXEN 500 MG PO TABS: 500.0000 mg | ORAL_TABLET | Freq: Two times a day (BID) | ORAL | Status: DC

## 2016-03-08 NOTE — Discharge Instructions (Signed)
Usted tiene medicamentos para el dolor ms fuertes de la sala de Actoremergencia en su casa. Siga tomando su medicamento segn las indicaciones y realice el seguimiento con su mdico.  You have stronger pain medication from the ER at home. Continue to take your medication as directed and follow up with your doctor.    Terapia con calor (Heat Therapy) La terapia con calor puede ayudar a aliviar articulaciones y msculos doloridos, lesionados, tensos y rgidos. El calor Mirantrelaja los msculos, lo cual puede ayudar a Engineer, materialsaliviar el dolor. La terapia con calor solo se debe usar en lesiones viejas, preexistentes o de larga duracin (crnicas). No use la terapia con calor a menos que se lo haya indicado el mdico. CMO USAR LA TERAPIA CON CALOR Existen varios tipos distintos de terapia con calor, como:  Compresas hmedas calientes.  Bao de agua caliente.  Bolsa de agua caliente.  Almohadilla trmica.  Bolsa de gel caliente.  Vendaje caliente.  Almohadilla trmica. RECOMENDACIONES GENERALES PARA LA TERAPIA CON CALOR   No duerma mientras [place] la terapia con calor. Utilice la terapia con calor solo mientras est despierto.  La piel puede volverse rosada mientras [place]a la terapia con calor. No use la terapia con calor si la piel se pone roja.  No use la terapia con calor si siente un dolor nuevo.  Una temperatura muy alta o una exposicin prolongada al calor puede causar quemaduras. Sea cauto con la terapia de calor para evitar quemar la piel.  No use la terapia con calor en zonas de la piel que ya estn irritadas, como con una erupcin o una quemadura de sol. SOLICITE AYUDA SI:   Observa ampollas, enrojecimiento, hinchazn o adormecimiento.  Siente un dolor nuevo.  El dolor Edgewater Parkempeora. ASEGRESE DE QUE:  Comprende estas instrucciones.  Controlar su afeccin.  Recibir ayuda de inmediato si no mejora o si empeora.   Esta informacin no tiene Theme park managercomo fin reemplazar el consejo del mdico.  Asegrese de hacerle al mdico cualquier pregunta que tenga.   Document Released: 12/13/2011 Document Revised: 10/11/2014 Elsevier Interactive Patient Education 2016 [employer]vier Inc.  Dolor sin causa aparente (Pain Without a Known Cause) QU ES EL DOLOR SIN CAUSA APARENTE? El dolor puede presentarse en cualquier parte del cuerpo y puede ser de leve a grave. En algunos casos, las causas del dolor se desconocen. Algunos tipos de dolor que pueden ocurrir sin causa aparente incluyen los siguientes:   Dolor de Turkmenistancabeza.  Dolor de espalda.  Dolor abdominal.  Dolor en el cuello. CMO SE DIAGNOSTICA EL DOLOR SIN CAUSA APARENTE?  Su mdico tratar de Production assistant, radiodeterminar la causa del dolor. Para esto, realizar lo siguiente:  Examen fsico.  Historia clnica.  Anlisis de [place]angre.  Anlisis de [place].  Radiografas. Si no se descubre ninguna causa, el mdico puede diagnosticarle "dolor sin causa aparente".  HAY TRATAMIENTO PARA EL DOLOR SIN CAUSA APARENTE?  El tratamiento depende del tipo de dolor que tenga. El mdico puede recetar medicamentos para ayudar a Primary school teachercalmar el dolor.  QU PUEDO HACER EN MI CASA PARA CONTROLAR EL DOLOR?   Tome los medicamentos solamente como se lo haya indicado el mdico.  Interrumpa las actividades que le causen dolor. En los momentos que sienta dolor intenso, haga reposo en cama.  Trate de reducir el estrs realizando actividades, como yoga o meditacin. Hable con su mdico para que le d otras recomendaciones sobre cmo reducir el estrs con la actividad fsica.  Haga ejercicio regularmente si el mdico lo autoriza.  Siga Neomia Dearuna  dieta saludable que incluya frutas y verduras. Esto puede Curator. Hable con el mdico si tiene alguna pregunta [name]. QU PASA SI EL DOLOR NO MEJORA?  Si tiene Scientific laboratory technician y no se Administrator, sports de Insurance risk surveyor o este [place], es importante que realice un seguimiento con su mdico. Puede ser necesario repetir las  pruebas y Education officer, environmental estudios ms profundos para hallar la posible causa.    Esta informacin no tiene Theme park manager el consejo del mdico. Asegrese de hacerle al mdico cualquier pregunta que tenga.   Document Released: 06/30/2005 Document Revised: 10/11/2014 Elsevier Interactive Patient Education Yahoo[employer] Inc.

## 2016-03-08 NOTE — ED Provider Notes (Signed)
CSN: [id number]     Arrival date & time 03/08/16  1515 History   First MD Initiated Contact with Patient 03/08/16 1601     Chief Complaint  Patient presents with  . Generalized Body Aches  . Nausea   (Consider location/radiation/quality/duration/timing/severity/associated sxs/prior Treatment) HPI History obtained from patient and her family (interpreter)   Pt presents with the cc of:  Bilateral leg pain Duration of symptoms: Today Treatment prior to arrival: None Context: Sudden onset of bilateral leg pain today. Other symptoms include: GERD symptoms which were reviewed in the emergency department just a couple of days ago. Pain score: 3 FAMILY HISTORY: Family history of diabetes    History reviewed. No pertinent past medical history. Past Surgical History  Procedure Laterality Date  . Cesarean section     History reviewed. No pertinent family history. Social History  Substance Use Topics  . Smoking status: Never Smoker   . Smokeless tobacco: None  . Alcohol Use: No   OB History    Gravida Para Term Preterm AB TAB SAB Ectopic Multiple Living   Review of Systems  Denies: HEADACHE, NAUSEA, ABDOMINAL PAIN, CHEST PAIN, CONGESTION, DYSURIA, SHORTNESS OF BREATH  Allergies  Review of patient's allergies indicates no known allergies.  Home Medications   Prior to Admission medications   Medication Sig Start Date End Date Taking? Authorizing Provider  amitriptyline (ELAVIL) 50 MG tablet Take 1 tablet (50 mg total) by mouth at bedtime. 02/11/15   Latrelle Dodrill, MD  aspirin EC 81 MG tablet Take 1 tablet (81 mg total) by mouth daily. 12/11/14   Latrelle Dodrill, MD  atorvastatin (LIPITOR) 40 MG tablet Take 1 tablet (40 mg total) by mouth daily. 12/11/14   Latrelle Dodrill, MD  cephALEXin (KEFLEX) 500 MG capsule Take 1 capsule (500 mg total) by mouth 2 (two) times daily. For 7 days 02/26/16   Latrelle Dodrill, MD  citalopram (CELEXA) 40 MG tablet Take  40 mg by mouth. 07/01/14   Historical Provider, MD  cyclobenzaprine (FLEXERIL) 10 MG tablet Take 1 tablet (10 mg total) by mouth 2 (two) times daily as needed for muscle spasms. 03/06/16   Leanne Chang, NP  Elastic Bandages & Supports (WRIST SPLINT/COCK-UP/LEFT M) MISC Wear splint nightly for 6 weeks. Patient may choose size. 02/11/15   Latrelle Dodrill, MD  gabapentin (NEURONTIN) 300 MG capsule Take 1 capsule (300 mg total) by mouth 3 (three) times daily. 12/11/14   Latrelle Dodrill, MD  HYDROcodone-acetaminophen (NORCO/VICODIN) 5-325 MG per tablet Take 1-2 tablets by mouth every 6 (six) hours as needed for moderate pain. 07/18/14   Vanetta Mulders, MD  ibuprofen (ADVIL,MOTRIN) 800 MG tablet Take 1 tablet (800 mg total) by mouth every 8 (eight) hours as needed. 08/27/14   John C Pick-Jacobs, DO  naproxen (NAPROSYN) 500 MG tablet Take 1 tablet (500 mg total) by mouth 2 (two) times daily with a meal. 03/08/16   Tharon Aquas, PA  predniSONE (DELTASONE) 20 MG tablet Take 2 tablets (40 mg total) by mouth daily. 02/11/15   Latrelle Dodrill, MD  ranitidine (ZANTAC) 150 MG tablet Take 1 tablet (150 mg total) by mouth 2 (two) times daily. 10/14/14   Latrelle Dodrill, MD  traMADol (ULTRAM) 50 MG tablet Take 1 tablet (50 mg total) by mouth every 6 (six) hours as needed. 03/06/16   Leanne Chang, NP   Meds  Ordered and Administered this Visit  Medications - No data to display  BP 134/82 mmHg  Pulse 60  Temp(Src) 97.7 F (36.5 C) (Oral)  Resp 12  SpO2 98%  LMP 03/06/2016 No data found.   Physical Exam NURSES NOTES AND VITAL SIGNS REVIEWED. CONSTITUTIONAL: Well developed, well nourished, no acute distress HEENT: normocephalic, atraumatic EYES: Conjunctiva normal NECK:normal ROM, supple, no adenopathy PULMONARY:No respiratory distress, normal effort ABDOMINAL: Soft, ND, NT BS+, No CVAT MUSCULOSKELETAL: Normal ROM of all extremities, both lower legs are without rash, tenderness, calf  tenderness. SKIN: warm and dry without rash PSYCHIATRIC: Mood and affect, behavior are normal  ED Course  Procedures (including critical care time)  Labs Review Labs Reviewed - No data to display  Imaging Review No results found.   Visual Acuity Review  Right Eye Distance:   Left Eye Distance:   Bilateral Distance:    Right Eye Near:   Left Eye Near:    Bilateral Near:      Description for Naprosyn  As discussed with patient is unclear why she is having chronic leg pain and hopefully will make a full recovery.   MDM   1. Leg pain, bilateral     Patient is reassured that there are no issues that require transfer to higher level of care at this time or additional tests. Patient is advised to continue home symptomatic treatment. Patient is advised that if there are new or worsening symptoms to attend the emergency department, contact primary care provider, or return to UC. Instructions of care provided discharged home in stable condition.    THIS NOTE WAS GENERATED USING A VOICE RECOGNITION SOFTWARE PROGRAM. ALL REASONABLE EFFORTS  WERE MADE TO PROOFREAD THIS DOCUMENT FOR ACCURACY.  I have verbally reviewed the discharge instructions with the patient. A printed AVS was given to the patient.  All questions were answered prior to discharge.      Tharon AquasFrank C Patrick, PA 03/08/16 1719

## 2016-03-08 NOTE — ED Notes (Signed)
Per interpretor, pt here for neck pain, HA, back pain and pain radiating into her legs. sts that it has been going on for a while and she was seen here Saturday for the same. Unable to get pain meds filled. sts that she took her brothers meds saturday and she has been sleepy and dizzy upon standing.

## 2016-03-09 ENCOUNTER — Encounter: Payer: Self-pay | Admitting: Family Medicine

## 2016-03-09 ENCOUNTER — Ambulatory Visit (INDEPENDENT_AMBULATORY_CARE_PROVIDER_SITE_OTHER): Payer: Self-pay | Admitting: Family Medicine

## 2016-03-09 VITALS — BP 121/76 | HR 62 | Temp 98.0°F | Wt 134.0 lb

## 2016-03-09 DIAGNOSIS — R300 Dysuria: Secondary | ICD-10-CM

## 2016-03-09 DIAGNOSIS — H538 Other visual disturbances: Secondary | ICD-10-CM

## 2016-03-09 DIAGNOSIS — R202 Paresthesia of skin: Secondary | ICD-10-CM

## 2016-03-09 DIAGNOSIS — R200 Anesthesia of skin: Secondary | ICD-10-CM

## 2016-03-09 LAB — POCT URINALYSIS DIPSTICK
Bilirubin, UA: NEGATIVE
GLUCOSE UA: NEGATIVE
KETONES UA: NEGATIVE
NITRITE UA: NEGATIVE
Spec Grav, UA: 1.02
Urobilinogen, UA: 1
pH, UA: 7.5

## 2016-03-09 NOTE — Progress Notes (Signed)
   Subjective:    Patient ID: Kristine Hayden , female   DOB: 1976-04-19 , 40 y.o..   MRN: [id number]  HPI  Kristine Hayden is here for a same day appointment for dizziness.  - Patient states she has 8/10 burning next pain for the last 3 years after a slip and fall at work - Recently neck pain has become worse - Over last 3 years she has had intermittent left upper and lower extremity numbness and tingling which have become constant over last week.  - Numbness and tingling worsen when she presses over her neck. - Admits to feeling "drunk" over last 2 days with drowsiness - Denies any bowel or bladder incontinence - Admits to blurry vision recently - Admits to headache (diffuse all over head and worsened with light) recently - At first denies any recent medication changes then admitted she has been taking Flexeril and Tramadol over last 2 days that she got in the emergency clinic    Review of Systems: Per HPI. All other systems reviewed and are negative.   Social Hx:  reports that she has never smoked. She does not have any smokeless tobacco history on file.    Objective:   BP 121/76 mmHg  Pulse 62  Temp(Src) 98 F (36.7 C)  Wt 134 lb (60.782 kg)  LMP 03/06/2016 Physical Exam  Gen: NAD, alert, cooperative with exam, well-appearing HEENT: NCAT, PERRL, clear conjunctiva, oropharynx clear, supple neck Cardiac: Regular rate and rhythm, normal S1/S2, no murmur, no edema, capillary refill brisk  Respiratory: Clear to auscultation bilaterally, no wheezes, non-labored Gastrointestinal: soft, non tender, non distended, bowel sounds present Skin: no rashes, normal turgor  MSK: tenderness to palpation of cervical spinous process  Neurologic Exam  Mental Status: alert; oriented to person, place and year;  language is normal Cranial Nerves: extraocular movements are full and conjugate; pupils are round reactive to light;symmetric facial strength; midline tongue  and uvula Motor: Normal strength, tone and mass; good fine motor movements; no pronator drift Sensory: intact throughout Coordination: good finger-to-nose Gait and Station: normal gait and station: patient is able to walk on heels, toes and tandem without difficulty; balance is adequate; Romberg exam is negative; Gower response is negative Reflexes: symmetric bilaterally; no clonus; bilateral flexor plantar responses   Assessment & Plan:  Numbness and tingling Felt in left upper and lower extremities. Associated with dizziness, headache, blurry vision, and drowsiness. Unclear etiology. No neurological deficits found on exam. 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 Was worked up for stroke and referred to neurology at that time. Drowsiness and dizziness likely due to recent Toradol and Flexeril use over the last couple days. Differentials include anemia vs complex migraine vs anxiety vs lingering symptoms from previous CVA. Advised patient to stop taking Tramadol. Red flag symptoms discussed. Will refer to neurology.

## 2016-03-09 NOTE — Patient Instructions (Signed)
Thank you for coming in today, it was so nice to see you.   I am going to make a referral to a neurologist for your numbness and tingling.   Please go to the hospital in if you pass out, the headache becomes worse, or can't walk.    If you have any questions or concerns, please do not hesitate to call the office at [phone]. You can also message me directly via MyChart.   Sincerely,  Anders Simmondshristina Vinicius Brockman, MD   Dolor de cabeza general sin causa (General Headache Without Cause) El dolor de cabeza es un dolor o Dentistmalestar que se siente en la zona de la cabeza o del cuello. Puede no tener una causa especfica. Hay muchas causas y tipos de dolores de Turk[place]istancabeza. Los dolores de cabeza ms comunes son los siguientes:  Cefalea tensional.  Cefaleas migraosas.  Cefalea en brotes.  Cefaleas diarias crnicas. INSTRUCCIONES PARA EL CUIDADO EN EL HOGAR  Controle su afeccin para ver si hay cambios. Siga estos pasos para Scientist, physiologicalcontrolar la afeccin: Control del Reynolds Americandolor  Tome los medicamentos de venta libre y los recetados solamente como se lo haya indicado el mdico.  Cuando sienta dolor de cabeza acustese en un cuarto oscuro y tranquilo.  Si se lo indican, aplique hielo sobre la cabeza y la zona del cuello:  Ponga el hielo en una bolsa plstica.  Coloque una toalla entre la piel y la bolsa de hielo.  Coloque el hielo durante 20minutos, 2 a 3veces por Futures traderda.  Utilice una almohadilla trmica o tome una ducha con agua caliente para aplicar calor en la cabeza y la zona del cuello como se lo haya indicado el mdico.  Mantenga las luces tenues si le Liz Claibornemolesta las luces brillantes o sus dolores de cabeza empeoran. Comida y bebida  Mantenga un horario para las comidas.  Limite el consumo de bebidas alcohlicas.  Consuma menos cantidad de cafena o deje de tomarla. Instrucciones generales  Concurra a todas las visitas de control como se lo haya indicado el mdico. Esto es importante.  Lleve un  diario de los dolores de cabeza para Financial risk analystaveriguar qu factores pueden desencadenarlos. Por ejemplo, escriba los siguientes datos:  Lo que usted come y Estate agentbebe.  Cunto tiempo duerme.  Algn cambio en su dieta o en los medicamentos.  Pruebe algunas tcnicas de relajacin, como los Halstadmasajes.  Limite el estrs.  Sintese con la espalda recta y no tense los msculos.  No consuma productos que contengan tabaco, incluidos cigarrillos, tabaco de Theatre managermascar o cigarrillos electrnicos. Si necesita ayuda para dejar de fumar, consulte al mdico.  Haga actividad fsica habitualmente como se lo haya indicado el mdico.  Tenga un horario fijo para dormir. Duerma entre 7 y 9horas o la cantidad de horas que le haya recomendado el mdico. SOLICITE ATENCIN MDICA SI:   Los medicamentos no Materials engineerlogran aliviar los sntomas.  Tiene un dolor de cabeza que es diferente del dolor de cabeza habitual.  Tiene nuseas o vmitos.  Tiene fiebre. SOLICITE ATENCIN MDICA DE INMEDIATO SI:   El dolor se hace cada vez ms intenso.  Ha vomitado repetidas veces.  Presenta rigidez en el cuello.  Sufre prdida de la visin.  Tiene problemas para hablar.  Siente dolor en el ojo o en el odo.  Presenta debilidad muscular o prdida del control muscular.  Pierde el equilibrio o tiene problemas para Advertising account plannercaminar.  Sufre mareos o se desmaya.  Se siente confundido.   Esta informacin no tiene Theme park managercomo fin reemplazar  el consejo del mdico. Asegrese de hacerle al mdico cualquier pregunta que tenga.   Document Released: 06/30/2005 Document Revised: 06/11/2015 Elsevier Interactive Patient Education [employer]

## 2016-03-10 NOTE — Assessment & Plan Note (Addendum)
Felt in left upper and lower extremities. Associated with dizziness, headache, blurry vision, and drowsiness. Unclear etiology. No neurological deficits found on exam. 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 Was worked up for stroke and referred to neurology at that time. Drowsiness and dizziness likely due to recent Toradol and Flexeril use over the last couple days. Differentials include anemia vs complex migraine vs anxiety vs lingering symptoms from previous CVA. Advised patient to stop taking Tramadol. Red flag symptoms discussed. Will refer to neurology.

## 2016-03-11 NOTE — Addendum Note (Signed)
Addended by: Beaulah DinningGAMBINO, Sohail Capraro M on: 03/11/2016 09:38 PM   Modules accepted: Orders

## 2016-03-23 ENCOUNTER — Ambulatory Visit: Payer: Self-pay

## 2016-04-02 ENCOUNTER — Ambulatory Visit: Payer: Self-pay | Attending: Family Medicine

## 2016-04-19 ENCOUNTER — Encounter: Payer: Self-pay | Admitting: Obstetrics & Gynecology

## 2016-06-10 ENCOUNTER — Ambulatory Visit (INDEPENDENT_AMBULATORY_CARE_PROVIDER_SITE_OTHER): Payer: Self-pay | Admitting: Student

## 2016-06-10 ENCOUNTER — Encounter: Payer: Self-pay | Admitting: Student

## 2016-06-10 VITALS — BP 108/65 | HR 64 | Temp 98.1°F | Ht 60.0 in | Wt 133.0 lb

## 2016-06-10 DIAGNOSIS — K0889 Other specified disorders of teeth and supporting structures: Secondary | ICD-10-CM

## 2016-06-10 MED ORDER — GUAIFENESIN-CODEINE 100-10 MG/5ML PO SYRP: 5.0000 mL | ORAL_SOLUTION | Freq: Three times a day (TID) | ORAL | 0 refills | Status: DC | PRN

## 2016-06-10 NOTE — Patient Instructions (Signed)
It was great seeing you today! We have addressed the following issues today  1. Cough and congestion: This is likely common cold (viral infection). There is no medication to cure viral infection. However drinking plenty of fluid (not coffee or soda) and enough rest can fasten recovery. I have also sent a prescription to the pharmacy for cough. This medicine can make you sleepy. Please don't drive or operate machinery after taking this medication. You can come back and see [place] if you have worsening of symptoms, difficulty breathing, chest pain or other symptoms concerning to you.  2. Toothache: I have sent a referral to a dentist. It might take some time before you hear about this referral.    If we did any lab work today, and the results require attention, either me or my nurse will get in touch with you. If everything is normal, you will get a letter in mail. If you don't hear from [place] in two weeks, please give [place] a call. Otherwise, I look forward to talking with you again at our next visit. If you have any questions or concerns before then, please call the clinic at [phone].  Please bring all your medications to every doctors visit   Sign up for My Chart to have easy access to your labs results, and communication with your Primary care physician.    Please check-out at the front desk before leaving the clinic.   Take Care,     Infeccin del tracto respiratorio superior, adultos (Upper Respiratory Infection, Adult) La mayora de las infecciones del tracto respiratorio superior son infecciones virales de las vas que llevan el aire a los pulmones. Un infeccin del tracto respiratorio superior afecta la nariz, la garganta y las vas respiratorias superiores. El tipo ms frecuente de infeccin del tracto respiratorio superior es la nasofaringitis, que habitualmente se conoce como "resfro comn". Las infecciones del tracto respiratorio superior siguen su curso y por lo general se curan  solas. En la [employer], la infeccin del tracto respiratorio superior no requiere atencin [place], Biomedical engineer a veces, despus de una infeccin viral, puede surgir una infeccin bacteriana en las vas respiratorias superiores. Esto se conoce como infeccin secundaria. Las infecciones sinusales y en el odo medio son tipos frecuentes de infecciones secundarias en el tracto respiratorio superior. La neumona bacteriana tambin puede complicar un cuadro de infeccin del tracto respiratorio superior. Este tipo de infeccin puede empeorar el asma y la enfermedad pulmonar obstructiva crnica (EPOC). En algunos casos, estas complicaciones pueden requerir atencin mdica de emergencia y poner en peligro la vida.  CAUSAS Casi todas las infecciones del tracto respiratorio superior se deben a los virus. Un virus es un tipo de microbio que puede contagiarse de Neomia Dear persona a Educational psychologist.  FACTORES DE RIESGO Puede estar en riesgo de sufrir una infeccin del tracto respiratorio superior si:   Fuma.  Tiene una enfermedad pulmonar o cardaca crnica.  Tiene debilitado el sistema de defensa (inmunitario) del cuerpo.  Es [address] o de edad muy [place].  Tiene asma o alergias nasales.  Trabaja en reas donde hay mucha gente o poca ventilacin.  Rudi Coco en una escuela o en un centro de atencin mdica. SIGNOS Y SNTOMAS  Habitualmente, los sntomas aparecen de 2a 3das despus de entrar en contacto con el virus del resfro. La mayora de las infecciones virales en el tracto respiratorio superior duran de 7a 10das. Sin embargo, las infecciones virales en el tracto respiratorio superior a causa del virus de  la gripe pueden durar de 14a 18das y, habitualmente, son ms graves. Entre los sntomas se pueden incluir los siguientes:   Secrecin o congestin nasal.  Estornudos.  Tos.  Dolor de Advertising copywriter.  Dolor de [place].  Fatiga.  Grant Ruts.  Prdida del apetito.  Dolor en la frente, detrs de los ojos  y por encima de los pmulos (dolor sinusal).  Dolores musculares. DIAGNSTICO  El mdico puede diagnosticar una infeccin del tracto respiratorio superior mediante los siguientes estudios:  Examen fsico.  Pruebas para verificar si los sntomas no se deben a otra afeccin, por ejemplo:  Faringitis estreptoccica.  Sinusitis.  Neumona.  Asma. TRATAMIENTO  Esta infeccin desaparece sola, con el tiempo. No puede curarse con medicamentos, pero a menudo se prescriben para aliviar los sntomas. Los medicamentos pueden ser tiles para lo siguiente:  Personal assistant fiebre.  Reducir la tos.  Aliviar la congestin nasal. INSTRUCCIONES PARA EL CUIDADO EN EL HOGAR   Tome los medicamentos solamente como se lo haya indicado el mdico.  A fin de Engineer, materials de garganta, haga grgaras con solucin salina templada o consuma caramelos para la tos, como se lo haya indicado el mdico.  Use un humidificador de vapor clido o inhale el vapor de la ducha para aumentar la humedad del aire. Esto facilitar la respiracin.  Beba suficiente lquido para Photographer orina clara o de color amarillo plido.  Consuma sopas y otros caldos transparentes, y [employer].  Descanse todo lo que sea necesario.  Regrese al Aleen Campi cuando la temperatura se le haya normalizado o cuando el mdico lo autorice. Es posible que deba quedarse en su casa durante un tiempo prolongado, para no infectar a los dems. Tambin puede usar un barbijo y lavarse las manos con cuidado para Transport planner propagacin del virus.  Aumente el uso del inhalador si tiene asma.  No consuma ningn producto que contenga tabaco, lo que incluye cigarrillos, tabaco de Theatre manager o Administrator, Civil Service. Si necesita ayuda para dejar de fumar, consulte al [employer]. PREVENCIN  La mejor manera de protegerse de un resfro es mantener una higiene [place].   Evite el contacto oral o fsico con personas que tengan sntomas de resfro.  En caso de  contacto, lvese las manos con frecuencia. No hay pruebas claras de que la vitaminaC, la vitaminaE, la equincea o el ejercicio reduzcan la probabilidad de Primary school teacher un resfro. Sin embargo, siempre se recomienda Insurance account manager, hacer ejercicio y Engineering geologist.  SOLICITE ATENCIN MDICA SI:   Su estado empeora en lugar de mejorar.  Los medicamentos no Estate agent.  Tiene escalofros.  La sensacin de falta de aire empeora.  Tiene mucosidad marrn o roja.  Tiene secrecin nasal amarilla o marrn.  Le duele la cara, especialmente al inclinarse hacia adelante.  Tiene fiebre.  Tiene los ganglios del cuello hinchados.  Siente dolor al tragar.  Tiene zonas blancas en la parte de atrs de la garganta. SOLICITE ATENCIN MDICA DE INMEDIATO SI:   Tiene sntomas intensos o persistentes de:  Dolor de [place].  Dolor de odos.  Dolor sinusal.  Dolor en el pecho.  Tiene enfermedad pulmonar crnica y cualquiera de estos sntomas:  Sibilancias.  Tos prolongada.  Tos con sangre.  Cambio en la mucosidad habitual.  Presenta rigidez en el cuello.  Tiene cambios en:  La visin.  La audicin.  El pensamiento.  El [place] de nimo. ASEGRESE DE QUE:   Comprende estas instrucciones.  Controlar su afeccin.  Recibir [place] de inmediato  si no mejora o si empeora.   Esta informacin no tiene Theme park managercomo fin reemplazar el consejo del mdico. Asegrese de hacerle al mdico cualquier pregunta que tenga.   Document Released: 06/30/2005 Document Revised: 02/04/2015 Elsevier Interactive Patient Education [employer]2016 Elsevier Inc.

## 2016-06-10 NOTE — Progress Notes (Signed)
Subjective:     Kristine Hayden is a 40 y.o. female who presents for evaluation of symptoms of URI. Symptoms include bilateral ear pressure/pain, congestion, coryza, nasal congestion, nausea without vomiting, non productive cough and sore throat. Onset of symptoms was 4 days ago, and has been unchanged since that time. Treatment to date: XL-3 which didn't help. She denies shortness of breath or chest pain.  She denies sick contact. Denies tobacco use.   Patient also reports having left lower molar tooth pain. This has been going on for months. He denies acute change. Denies a swelling.  Wants to know if she can get a referral to a dentist.   Review of Systems Per history of present illness  Objective:   GEN: appears well, no apparent distress. HEENT: normocephalic and atraumatic, no conjunctival injection, sclera anicteric, normal TM and ear canal, positive for rhinorrhea, congestion and erythema of the turbinates, mmm without erythema or exudation. No apparent dental caries or gum swelling or sign of inflammation or infection.  CVS: RRR, normal s1 and s2, no murmurs, no edema, cap refills less than 3 seconds RESP: Frequent cough, no increased work of breathing, good air movement bilaterally, no crackles or wheeze GI: soft, non-tender,non-distended, bowel sounds present and normal HEM: No cervical, periauricular or occipitalis lymphadenopathy NEURO: alert and oriented appropriately, no gross defecits  PSYCH: appropriate mood and affect   Assessment:  Patient with cough and congestion likely due to viral upper respiratory tract infection. No sign of respiratory distress, shortness of breath or chest pain. Exam are unremarkable except for frequent cough, rhinorrhea, congestion and erythema of nasal turbinates.   Toothache: This is a chronic issue. Exam negative for active infection or inflammation.  Plan:  [facility]. Warned about the sedative  side effect of this medication as described in AVS. Great good hydration and adequate rest. Discussed return precautions.  Ambulatory referral to a dentist for toothache      [employer] video interpreter ID [phone] [name] was used for the entire encounter.

## 2016-06-14 ENCOUNTER — Encounter: Payer: Self-pay | Admitting: Family Medicine

## 2016-07-02 ENCOUNTER — Ambulatory Visit (INDEPENDENT_AMBULATORY_CARE_PROVIDER_SITE_OTHER): Payer: Self-pay | Admitting: Family Medicine

## 2016-07-02 ENCOUNTER — Encounter: Payer: Self-pay | Admitting: Family Medicine

## 2016-07-02 ENCOUNTER — Other Ambulatory Visit (HOSPITAL_COMMUNITY)
Admission: RE | Admit: 2016-07-02 | Discharge: 2016-07-02 | Disposition: A | Discharge status: Home / Self Care | Payer: Self-pay | Source: Ambulatory Visit | Attending: Family Medicine | Admitting: Family Medicine

## 2016-07-02 ENCOUNTER — Telehealth: Payer: Self-pay | Admitting: Family Medicine

## 2016-07-02 VITALS — BP 117/74 | HR 54 | Temp 97.7°F | Wt 131.0 lb

## 2016-07-02 DIAGNOSIS — Z01419 Encounter for gynecological examination (general) (routine) without abnormal findings: Secondary | ICD-10-CM | POA: Insufficient documentation

## 2016-07-02 DIAGNOSIS — Z0000 Encounter for general adult medical examination without abnormal findings: Secondary | ICD-10-CM | POA: Insufficient documentation

## 2016-07-02 DIAGNOSIS — Z124 Encounter for screening for malignant neoplasm of cervix: Secondary | ICD-10-CM

## 2016-07-02 DIAGNOSIS — Z1151 Encounter for screening for human papillomavirus (HPV): Secondary | ICD-10-CM | POA: Insufficient documentation

## 2016-07-02 DIAGNOSIS — R300 Dysuria: Secondary | ICD-10-CM

## 2016-07-02 DIAGNOSIS — Z113 Encounter for screening for infections with a predominantly sexual mode of transmission: Secondary | ICD-10-CM | POA: Insufficient documentation

## 2016-07-02 DIAGNOSIS — N898 Other specified noninflammatory disorders of vagina: Secondary | ICD-10-CM

## 2016-07-02 DIAGNOSIS — Z23 Encounter for immunization: Secondary | ICD-10-CM

## 2016-07-02 LAB — POCT WET PREP (WET MOUNT)
Clue Cells Wet Prep Whiff POC: NEGATIVE
Trichomonas Wet Prep HPF POC: ABSENT

## 2016-07-02 LAB — POCT URINALYSIS DIPSTICK
Bilirubin, UA: NEGATIVE
GLUCOSE UA: NEGATIVE
Ketones, UA: NEGATIVE
LEUKOCYTES UA: NEGATIVE
NITRITE UA: NEGATIVE
PH UA: 7
Protein, UA: NEGATIVE
Spec Grav, UA: 1.02
Urobilinogen, UA: 0.2

## 2016-07-02 LAB — POCT URINE PREGNANCY: Preg Test, Ur: NEGATIVE

## 2016-07-02 LAB — POCT UA - MICROSCOPIC ONLY

## 2016-07-02 NOTE — Progress Notes (Signed)
Subjective:     Patient ID: Landry MellowMa Rosa Martinez-Encarnacion, female   DOB: [birth year], 40 y.o.   MRN: [id number]  HPI Mrs. Martinez-Encarnacion is a 40 year old female with a history of UTI, CVA and chronic back pain presenting to clinic today for evaluation of dysuria.  Dysuria The patient reports having sexual intercourse with her husband last week and began to experience vaginal irritation and itching, followed by burning sensation with urination. She tried Montistat 7 cream with some relief of symptoms. Yesterday (9/28), the patient began to have urine that was a dark yellow-orange color. She also reports bilateral flank pain. Does not report any recent trauma or new rash in the vulvar area. She denies fever, vaginal discharge, urinary incontinence, N/V, HA, bowel changes, abdominal pain, chest pain, cough, shortness of breath, sick contacts.  Health Maintenance Pt is due for flu vaccine, HIV, tetanus, and pap smear.  Review of Systems Pertinent positives and negatives per HPI.    Objective:   Physical Exam  Constitutional: She appears well-developed and well-nourished. No distress.  HENT:  Mouth/Throat: Oropharynx is clear and moist. No oropharyngeal exudate.  Eyes: Conjunctivae and EOM are normal. Pupils are equal, round, and reactive to light.  Cardiovascular: Normal rate and regular rhythm.   No murmur heard. Pulmonary/Chest: Effort normal and breath sounds normal. She has no wheezes. She has no rales.  Abdominal: Soft. Bowel sounds are normal.  Suprapubic tenderness. Mild bilateral CVA tenderness.   Genitourinary: Vagina normal. There is no rash or tenderness on the right labia. There is no rash or tenderness on the left labia. Uterus is tender. Cervix exhibits discharge.  Genitourinary Comments: Ectropion with several nabothian cysts   Musculoskeletal: She exhibits no edema.  Skin: No rash noted.  BP 117/74 (BP Location: Left Arm, Patient Position: Sitting, Cuff Size: Normal)    Pulse (!) 54   Temp 97.7 F (36.5 C) (Oral)   Wt 131 lb (59.4 kg)   LMP 06/09/2016   BMI 25.58 kg/m   POC UA Dipstick positive for blood.     Assessment:  Mrs. Ned CardMartinez-Encarnacion is a 40 year old female with a history of UTI, CVA and chronic back pain presenting to clinic today for evaluation of dysuria.    Plan:  Vaginal irritation Vaginal irritation and dysuria. UA positive for blood but negative for Nitrate and LE.  -wet prep obtained and pending -microscopic urine exam and culture pending -vaginal cultures pending -blood work to check for HIV/RPR/Acute Hepatitis -consider CT abdomen to evaluate for kidney stones if cultures negative  Healthcare maintenance -pap smear performed in clinic today -flu vaccine administered

## 2016-07-02 NOTE — Patient Instructions (Addendum)
It was a pleasure seeing you in clinic today Kristine Hayden! We discussed your vaginal pain and bloody urine.  Bloody Urine/Vaginal Pain -we think that you have a vaginal infection -Will analyze your urine and vaginal culture for bacteria and call you with the results -you can continue to use the cream to help with the itching  Health Maintenance -we performed a pap smear today  Return to clinic if your symptoms persist or worsen

## 2016-07-02 NOTE — Progress Notes (Signed)
poc

## 2016-07-02 NOTE — Assessment & Plan Note (Signed)
Vaginal irritation and dysuria. UA positive for blood but negative for Nitrate and LE.  -wet prep obtained and pending -microscopic urine exam and culture pending -vaginal cultures pending -blood work to check for HIV/RPR/Acute Hepatitis -consider CT abdomen to evaluate for kidney stones if cultures negative

## 2016-07-02 NOTE — Telephone Encounter (Signed)
Attempted to contact patient using [employer] to discuss lab results. No answer and unable to leave voicemail.

## 2016-07-02 NOTE — Assessment & Plan Note (Signed)
-  pap smear performed in clinic today -flu vaccine administered

## 2016-07-03 LAB — HEPATITIS PANEL, ACUTE
HCV AB: NEGATIVE
HEP B C IGM: NONREACTIVE
Hep A IgM: NONREACTIVE
Hepatitis B Surface Ag: NEGATIVE

## 2016-07-03 LAB — URINE CULTURE

## 2016-07-03 LAB — HIV ANTIBODY (ROUTINE TESTING W REFLEX): HIV: NONREACTIVE

## 2016-07-03 LAB — RPR

## 2016-07-05 LAB — CERVICOVAGINAL ANCILLARY ONLY
CHLAMYDIA, DNA PROBE: NEGATIVE
Neisseria Gonorrhea: NEGATIVE

## 2016-07-05 LAB — CYTOLOGY - PAP

## 2016-07-06 ENCOUNTER — Encounter: Payer: Self-pay | Admitting: Family Medicine

## 2016-07-06 NOTE — Telephone Encounter (Signed)
Attempted to contact patient using [employer] to discuss lab results. No answer.

## 2016-07-07 NOTE — Telephone Encounter (Signed)
Contacted patient using [employer]. Discussed negative urine culture and vaginal cultures, Does follow with urology but missed her appointment on 10/3. Patient still reports dysuria/swelling in vaginal area. Informed patient to call urology immediately to reschedule.   RN staff - please call patient and schedule lab visit for repeat urine collection. Please also place poc labs for UA.

## 2016-07-19 NOTE — Telephone Encounter (Signed)
Attempted to call pt with [employer] interpret[employer] (derrick [phone]), it went to her voicemail. He left a message for her to call us back. Pepper Wyndham Bruna PotterBlount, CMA

## 2016-07-23 NOTE — Telephone Encounter (Signed)
Left another message for patient to return call.

## 2016-11-23 ENCOUNTER — Emergency Department (HOSPITAL_COMMUNITY)
Admission: EM | Admit: 2016-11-23 | Discharge: 2016-11-23 | Disposition: A | Discharge status: Home / Self Care | Payer: No Typology Code available for payment source | Attending: Emergency Medicine | Admitting: Emergency Medicine

## 2016-11-23 ENCOUNTER — Emergency Department (HOSPITAL_COMMUNITY): Payer: No Typology Code available for payment source

## 2016-11-23 ENCOUNTER — Encounter (HOSPITAL_COMMUNITY): Payer: Self-pay

## 2016-11-23 DIAGNOSIS — S0502XA Injury of conjunctiva and corneal abrasion without foreign body, left eye, initial encounter: Secondary | ICD-10-CM | POA: Insufficient documentation

## 2016-11-23 DIAGNOSIS — Z8673 Personal history of transient ischemic attack (TIA), and cerebral infarction without residual deficits: Secondary | ICD-10-CM | POA: Insufficient documentation

## 2016-11-23 DIAGNOSIS — Y92410 Unspecified street and highway as the place of occurrence of the external cause: Secondary | ICD-10-CM | POA: Insufficient documentation

## 2016-11-23 DIAGNOSIS — S161XXA Strain of muscle, fascia and tendon at neck level, initial encounter: Secondary | ICD-10-CM | POA: Insufficient documentation

## 2016-11-23 DIAGNOSIS — R55 Syncope and collapse: Secondary | ICD-10-CM | POA: Diagnosis not present

## 2016-11-23 DIAGNOSIS — S199XXA Unspecified injury of neck, initial encounter: Secondary | ICD-10-CM | POA: Diagnosis present

## 2016-11-23 DIAGNOSIS — Y939 Activity, unspecified: Secondary | ICD-10-CM | POA: Insufficient documentation

## 2016-11-23 DIAGNOSIS — Y999 Unspecified external cause status: Secondary | ICD-10-CM | POA: Insufficient documentation

## 2016-11-23 DIAGNOSIS — Z7982 Long term (current) use of aspirin: Secondary | ICD-10-CM | POA: Diagnosis not present

## 2016-11-23 DIAGNOSIS — T07XXXA Unspecified multiple injuries, initial encounter: Secondary | ICD-10-CM

## 2016-11-23 MED ORDER — TOBRAMYCIN 0.3 % OP SOLN
1.0000 [drp] | OPHTHALMIC | Status: DC
  Administered 2016-11-23: 1 [drp] via OPHTHALMIC
  Filled 2016-11-23: qty 5

## 2016-11-23 MED ORDER — NAPROXEN 500 MG PO TABS: 500.0000 mg | ORAL_TABLET | Freq: Two times a day (BID) | ORAL | 0 refills | Status: DC

## 2016-11-23 MED ORDER — FLUORESCEIN SODIUM 0.6 MG OP STRP
1.0000 | ORAL_STRIP | Freq: Once | OPHTHALMIC | Status: AC
  Administered 2016-11-23: 1 via OPHTHALMIC
  Filled 2016-11-23: qty 1

## 2016-11-23 MED ORDER — METHOCARBAMOL 500 MG PO TABS: 500.0000 mg | ORAL_TABLET | Freq: Two times a day (BID) | ORAL | 0 refills | Status: DC

## 2016-11-23 NOTE — ED Notes (Signed)
Patient transported to CT 
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
## 2016-11-23 NOTE — ED Notes (Signed)
ED provider at bedside.
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
## 2016-11-23 NOTE — Discharge Instructions (Signed)
Call Dr. Charlotte SanesMcCuen for outpatient eye doctor appointment if eyes are still painful in 48 hours.  Naproxen, and Robaxin for muscle aches and pains over the next 2 days.  Eyedrops as prescribed every 4 hours.

## 2016-11-23 NOTE — ED Notes (Signed)
Patient transported to X-ray 

## 2016-11-29 ENCOUNTER — Ambulatory Visit: Payer: Self-pay | Admitting: Internal Medicine

## 2017-01-03 ENCOUNTER — Telehealth: Payer: Self-pay | Admitting: Family Medicine

## 2017-01-04 ENCOUNTER — Ambulatory Visit (INDEPENDENT_AMBULATORY_CARE_PROVIDER_SITE_OTHER): Payer: Self-pay | Admitting: Family Medicine

## 2017-01-04 MED ORDER — NAPROXEN 500 MG PO TABS
500.0000 mg | ORAL_TABLET | Freq: Two times a day (BID) | ORAL | 1 refills | Status: AC
Start: 2017-01-04 — End: ?
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
## 2017-01-14 ENCOUNTER — Encounter: Payer: Self-pay | Admitting: Family Medicine

## 2017-01-21 ENCOUNTER — Encounter (HOSPITAL_COMMUNITY): Payer: Self-pay | Admitting: Oncology

## 2017-01-21 DIAGNOSIS — M79604 Pain in right leg: Secondary | ICD-10-CM | POA: Insufficient documentation

## 2017-01-21 DIAGNOSIS — M79602 Pain in left arm: Secondary | ICD-10-CM | POA: Insufficient documentation

## 2017-01-21 DIAGNOSIS — Z7982 Long term (current) use of aspirin: Secondary | ICD-10-CM | POA: Insufficient documentation

## 2017-01-21 DIAGNOSIS — Y939 Activity, unspecified: Secondary | ICD-10-CM | POA: Insufficient documentation

## 2017-01-21 DIAGNOSIS — M79605 Pain in left leg: Secondary | ICD-10-CM | POA: Insufficient documentation

## 2017-01-21 DIAGNOSIS — Y92410 Unspecified street and highway as the place of occurrence of the external cause: Secondary | ICD-10-CM | POA: Insufficient documentation

## 2017-01-21 DIAGNOSIS — Z8673 Personal history of transient ischemic attack (TIA), and cerebral infarction without residual deficits: Secondary | ICD-10-CM | POA: Insufficient documentation

## 2017-01-21 DIAGNOSIS — Y999 Unspecified external cause status: Secondary | ICD-10-CM | POA: Insufficient documentation

## 2017-01-21 DIAGNOSIS — Z79899 Other long term (current) drug therapy: Secondary | ICD-10-CM | POA: Insufficient documentation

## 2017-01-21 DIAGNOSIS — M542 Cervicalgia: Secondary | ICD-10-CM | POA: Insufficient documentation

## 2017-01-21 DIAGNOSIS — M79601 Pain in right arm: Secondary | ICD-10-CM | POA: Insufficient documentation
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
## 2017-01-22 ENCOUNTER — Emergency Department (HOSPITAL_COMMUNITY): Payer: Self-pay

## 2017-01-22 ENCOUNTER — Emergency Department (HOSPITAL_COMMUNITY)
Admission: EM | Admit: 2017-01-22 | Discharge: 2017-01-22 | Disposition: A | Discharge status: Home / Self Care | Payer: Self-pay | Attending: Emergency Medicine | Admitting: Emergency Medicine

## 2017-01-22 DIAGNOSIS — M79601 Pain in right arm: Secondary | ICD-10-CM

## 2017-01-22 DIAGNOSIS — M79602 Pain in left arm: Secondary | ICD-10-CM

## 2017-01-22 DIAGNOSIS — M79605 Pain in left leg: Secondary | ICD-10-CM

## 2017-01-22 DIAGNOSIS — M542 Cervicalgia: Secondary | ICD-10-CM

## 2017-01-22 DIAGNOSIS — M79604 Pain in right leg: Secondary | ICD-10-CM

## 2017-01-22 MED ORDER — HYDROCODONE-ACETAMINOPHEN 5-325 MG PO TABS: 1.0000 | ORAL_TABLET | ORAL | 0 refills | Status: DC | PRN

## 2017-01-22 MED ORDER — HYDROCODONE-ACETAMINOPHEN 5-325 MG PO TABS
1.0000 | ORAL_TABLET | Freq: Once | ORAL | Status: AC
  Administered 2017-01-22: 1 via ORAL
  Filled 2017-01-22: qty 1
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
## 2017-01-26 ENCOUNTER — Encounter: Payer: Self-pay | Admitting: Neurology

## 2017-01-26 ENCOUNTER — Ambulatory Visit (INDEPENDENT_AMBULATORY_CARE_PROVIDER_SITE_OTHER): Payer: Self-pay | Admitting: Neurology

## 2017-01-26 DIAGNOSIS — M79672 Pain in left foot: Secondary | ICD-10-CM | POA: Insufficient documentation

## 2017-01-26 DIAGNOSIS — R202 Paresthesia of skin: Secondary | ICD-10-CM | POA: Insufficient documentation

## 2017-01-26 MED ORDER — TIZANIDINE HCL 4 MG PO TABS: 4.0000 mg | ORAL_TABLET | Freq: Three times a day (TID) | ORAL | 6 refills | Status: AC | PRN

## 2017-01-26 MED ORDER — MELOXICAM 15 MG PO TABS: 15.0000 mg | ORAL_TABLET | Freq: Every day | ORAL | 11 refills | Status: AC

## 2017-01-26 NOTE — Patient Instructions (Signed)
[facility]   Address: [address] [place], [place] [address]    Phone: [phone]
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
## 2017-02-02 ENCOUNTER — Ambulatory Visit: Payer: Self-pay | Attending: Neurology | Admitting: Physical Therapy

## 2017-02-02 DIAGNOSIS — M5442 Lumbago with sciatica, left side: Secondary | ICD-10-CM | POA: Insufficient documentation

## 2017-02-02 DIAGNOSIS — M5441 Lumbago with sciatica, right side: Secondary | ICD-10-CM | POA: Insufficient documentation

## 2017-02-02 DIAGNOSIS — M5413 Radiculopathy, cervicothoracic region: Secondary | ICD-10-CM | POA: Insufficient documentation

## 2017-02-02 DIAGNOSIS — R293 Abnormal posture: Secondary | ICD-10-CM | POA: Insufficient documentation

## 2017-02-02 DIAGNOSIS — M542 Cervicalgia: Secondary | ICD-10-CM | POA: Insufficient documentation

## 2017-02-02 DIAGNOSIS — M6281 Muscle weakness (generalized): Secondary | ICD-10-CM | POA: Insufficient documentation
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
## 2017-02-04 ENCOUNTER — Encounter (INDEPENDENT_AMBULATORY_CARE_PROVIDER_SITE_OTHER): Payer: Self-pay | Admitting: Neurology

## 2017-02-04 ENCOUNTER — Ambulatory Visit (INDEPENDENT_AMBULATORY_CARE_PROVIDER_SITE_OTHER): Payer: Self-pay | Admitting: Neurology

## 2017-02-04 ENCOUNTER — Ambulatory Visit
Admission: RE | Admit: 2017-02-04 | Discharge: 2017-02-04 | Disposition: A | Discharge status: Home / Self Care | Payer: Self-pay | Source: Ambulatory Visit | Attending: Neurology | Admitting: Neurology

## 2017-02-04 ENCOUNTER — Telehealth: Payer: Self-pay | Admitting: Neurology

## 2017-02-04 DIAGNOSIS — R202 Paresthesia of skin: Secondary | ICD-10-CM

## 2017-02-04 DIAGNOSIS — M542 Cervicalgia: Secondary | ICD-10-CM

## 2017-02-04 DIAGNOSIS — G8929 Other chronic pain: Secondary | ICD-10-CM

## 2017-02-04 DIAGNOSIS — M79672 Pain in left foot: Secondary | ICD-10-CM

## 2017-02-04 DIAGNOSIS — M545 Low back pain, unspecified: Secondary | ICD-10-CM

## 2017-02-04 DIAGNOSIS — Z0289 Encounter for other administrative examinations: Secondary | ICD-10-CM

## 2017-02-04 NOTE — Telephone Encounter (Signed)
X-ray of left foot showed some abnormality, but its interpretation is limited because of artifact of overlapping structure, if she still has significant left foot pain, may consider orthopedic evaluation  Curvilinear lucency through the proximal third metatarsal bone may represent a nondisplaced transverse fracture. However it is only seen on one view and therefore artifact of overlaping structures cannot be excluded. Please correlate to clinical exam.
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
## 2017-02-07 ENCOUNTER — Other Ambulatory Visit: Payer: Self-pay | Admitting: *Deleted

## 2017-02-07 ENCOUNTER — Telehealth: Payer: Self-pay | Admitting: Neurology

## 2017-02-07 DIAGNOSIS — M79672 Pain in left foot: Secondary | ICD-10-CM

## 2017-02-07 NOTE — Telephone Encounter (Signed)
I will return patient's call using the language interpreter line.  See other phone note for further information.

## 2017-02-07 NOTE — Telephone Encounter (Signed)
Attempted to reach patient - no answer - unable to leave message (voicemail box full).  Also, attempted to reach her sister on DPR - no answer - unable to leave message.

## 2017-02-07 NOTE — Telephone Encounter (Signed)
Pt returned your call but is needing translation. Please call back. If need be I can help with the translation. Thank you.

## 2017-02-07 NOTE — Telephone Encounter (Signed)
Pt returned RN's call. Pt speaks spanish.

## 2017-02-07 NOTE — Telephone Encounter (Signed)
Liz ClaiborneCalled [employer] language line [phone]) and spoke to [place] Lake[place].  He translated the results below.  The patient reported still having significant left foot pain and would like a referral to orthopaedics.

## 2017-02-09 ENCOUNTER — Ambulatory Visit: Payer: Self-pay

## 2017-02-09 DIAGNOSIS — M5413 Radiculopathy, cervicothoracic region: Secondary | ICD-10-CM

## 2017-02-09 DIAGNOSIS — M5442 Lumbago with sciatica, left side: Principal | ICD-10-CM

## 2017-02-09 DIAGNOSIS — M6281 Muscle weakness (generalized): Secondary | ICD-10-CM

## 2017-02-09 DIAGNOSIS — M542 Cervicalgia: Secondary | ICD-10-CM

## 2017-02-09 DIAGNOSIS — M5441 Lumbago with sciatica, right side: Secondary | ICD-10-CM

## 2017-02-09 DIAGNOSIS — R293 Abnormal posture: Secondary | ICD-10-CM

## 2017-02-09 NOTE — Patient Instructions (Signed)
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
## 2017-02-11 ENCOUNTER — Ambulatory Visit: Payer: Self-pay

## 2017-02-11 DIAGNOSIS — R293 Abnormal posture: Secondary | ICD-10-CM

## 2017-02-11 DIAGNOSIS — M5441 Lumbago with sciatica, right side: Secondary | ICD-10-CM

## 2017-02-11 DIAGNOSIS — M542 Cervicalgia: Secondary | ICD-10-CM

## 2017-02-11 DIAGNOSIS — M5413 Radiculopathy, cervicothoracic region: Secondary | ICD-10-CM

## 2017-02-11 DIAGNOSIS — M5442 Lumbago with sciatica, left side: Principal | ICD-10-CM
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
## 2017-02-16 ENCOUNTER — Ambulatory Visit: Payer: Self-pay

## 2017-02-17 ENCOUNTER — Ambulatory Visit
Admission: RE | Admit: 2017-02-17 | Discharge: 2017-02-17 | Disposition: A | Discharge status: Home / Self Care | Payer: Self-pay | Source: Ambulatory Visit | Attending: Neurology | Admitting: Neurology

## 2017-02-17 DIAGNOSIS — R202 Paresthesia of skin: Secondary | ICD-10-CM

## 2017-02-17 DIAGNOSIS — M79672 Pain in left foot: Secondary | ICD-10-CM

## 2017-02-18 ENCOUNTER — Ambulatory Visit: Payer: Self-pay | Admitting: Physical Therapy

## 2017-02-18 DIAGNOSIS — R293 Abnormal posture: Secondary | ICD-10-CM

## 2017-02-18 DIAGNOSIS — M6281 Muscle weakness (generalized): Secondary | ICD-10-CM

## 2017-02-18 DIAGNOSIS — M5441 Lumbago with sciatica, right side: Secondary | ICD-10-CM

## 2017-02-18 DIAGNOSIS — M5442 Lumbago with sciatica, left side: Principal | ICD-10-CM

## 2017-02-18 DIAGNOSIS — M5413 Radiculopathy, cervicothoracic region: Secondary | ICD-10-CM

## 2017-02-18 DIAGNOSIS — M542 Cervicalgia: Secondary | ICD-10-CM
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
## 2017-02-23 ENCOUNTER — Ambulatory Visit: Payer: Self-pay | Admitting: Physical Therapy

## 2017-02-25 ENCOUNTER — Ambulatory Visit: Payer: Self-pay | Admitting: Rehabilitation

## 2017-02-25 ENCOUNTER — Other Ambulatory Visit (HOSPITAL_COMMUNITY): Payer: Self-pay | Admitting: *Deleted

## 2017-02-25 DIAGNOSIS — N644 Mastodynia: Secondary | ICD-10-CM

## 2017-03-02 ENCOUNTER — Ambulatory Visit: Payer: Self-pay

## 2017-03-02 DIAGNOSIS — M542 Cervicalgia: Secondary | ICD-10-CM

## 2017-03-02 DIAGNOSIS — M5441 Lumbago with sciatica, right side: Secondary | ICD-10-CM

## 2017-03-02 DIAGNOSIS — M6281 Muscle weakness (generalized): Secondary | ICD-10-CM

## 2017-03-02 DIAGNOSIS — R293 Abnormal posture: Secondary | ICD-10-CM

## 2017-03-02 DIAGNOSIS — M5442 Lumbago with sciatica, left side: Principal | ICD-10-CM

## 2017-03-02 DIAGNOSIS — M5413 Radiculopathy, cervicothoracic region: Secondary | ICD-10-CM
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
## 2017-03-04 ENCOUNTER — Ambulatory Visit: Payer: Self-pay | Admitting: Rehabilitation

## 2017-03-09 ENCOUNTER — Ambulatory Visit: Payer: Self-pay

## 2017-03-10 ENCOUNTER — Ambulatory Visit: Payer: Self-pay | Attending: Neurology

## 2017-03-10 DIAGNOSIS — M5442 Lumbago with sciatica, left side: Secondary | ICD-10-CM | POA: Insufficient documentation

## 2017-03-10 DIAGNOSIS — M5413 Radiculopathy, cervicothoracic region: Secondary | ICD-10-CM

## 2017-03-10 DIAGNOSIS — M5441 Lumbago with sciatica, right side: Secondary | ICD-10-CM

## 2017-03-10 DIAGNOSIS — M542 Cervicalgia: Secondary | ICD-10-CM

## 2017-03-10 DIAGNOSIS — R293 Abnormal posture: Secondary | ICD-10-CM

## 2017-03-10 DIAGNOSIS — M6281 Muscle weakness (generalized): Secondary | ICD-10-CM
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
## 2017-03-16 ENCOUNTER — Ambulatory Visit: Payer: Self-pay

## 2017-03-17 ENCOUNTER — Ambulatory Visit (HOSPITAL_COMMUNITY): Payer: Self-pay

## 2017-03-17 ENCOUNTER — Other Ambulatory Visit: Payer: Self-pay

## 2017-03-18 ENCOUNTER — Ambulatory Visit: Payer: Self-pay | Admitting: Physical Therapy

## 2017-03-23 ENCOUNTER — Ambulatory Visit: Payer: Self-pay | Admitting: Physical Therapy

## 2017-03-23 DIAGNOSIS — R293 Abnormal posture: Secondary | ICD-10-CM

## 2017-03-23 DIAGNOSIS — M6281 Muscle weakness (generalized): Secondary | ICD-10-CM

## 2017-03-23 DIAGNOSIS — M5413 Radiculopathy, cervicothoracic region: Secondary | ICD-10-CM

## 2017-03-23 DIAGNOSIS — M5442 Lumbago with sciatica, left side: Principal | ICD-10-CM

## 2017-03-23 DIAGNOSIS — M5441 Lumbago with sciatica, right side: Secondary | ICD-10-CM

## 2017-03-23 DIAGNOSIS — M542 Cervicalgia: Secondary | ICD-10-CM
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
## 2017-03-25 ENCOUNTER — Ambulatory Visit: Payer: Self-pay | Admitting: Physical Therapy

## 2017-03-31 ENCOUNTER — Ambulatory Visit: Payer: Self-pay | Admitting: Physical Therapy

## 2017-03-31 DIAGNOSIS — M5413 Radiculopathy, cervicothoracic region: Secondary | ICD-10-CM

## 2017-03-31 DIAGNOSIS — M542 Cervicalgia: Secondary | ICD-10-CM

## 2017-03-31 DIAGNOSIS — M6281 Muscle weakness (generalized): Secondary | ICD-10-CM

## 2017-03-31 DIAGNOSIS — M5442 Lumbago with sciatica, left side: Principal | ICD-10-CM

## 2017-03-31 DIAGNOSIS — M5441 Lumbago with sciatica, right side: Secondary | ICD-10-CM

## 2017-03-31 DIAGNOSIS — R293 Abnormal posture: Secondary | ICD-10-CM
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
## 2017-04-05 ENCOUNTER — Encounter: Payer: Self-pay | Admitting: Nurse Practitioner

## 2017-04-05 ENCOUNTER — Encounter (INDEPENDENT_AMBULATORY_CARE_PROVIDER_SITE_OTHER): Payer: Self-pay

## 2017-04-05 ENCOUNTER — Telehealth: Payer: Self-pay | Admitting: Nurse Practitioner

## 2017-04-05 ENCOUNTER — Ambulatory Visit (INDEPENDENT_AMBULATORY_CARE_PROVIDER_SITE_OTHER): Payer: Self-pay | Admitting: Nurse Practitioner

## 2017-04-05 VITALS — BP 110/75 | HR 61 | Wt 139.4 lb

## 2017-04-05 DIAGNOSIS — M79672 Pain in left foot: Secondary | ICD-10-CM

## 2017-04-05 DIAGNOSIS — M542 Cervicalgia: Secondary | ICD-10-CM

## 2017-04-05 DIAGNOSIS — R202 Paresthesia of skin: Secondary | ICD-10-CM

## 2017-04-05 DIAGNOSIS — M549 Dorsalgia, unspecified: Secondary | ICD-10-CM

## 2017-04-05 NOTE — Telephone Encounter (Signed)
[facility] neck, spine, back pain [facility].

## 2017-04-05 NOTE — Telephone Encounter (Signed)
Ok but MRI did not show much

## 2017-04-05 NOTE — Patient Instructions (Signed)
Will refer to ortho  No follow up here

## 2017-04-05 NOTE — Telephone Encounter (Signed)
Referral Sent.   Patient was here in the office [facility] called patient x3 to schedule no call back. I called [facility] and patient is rescheduled  For July 12 th arrive at 11:15 for 11:45 apt .  Patient will have to bring $250.00 to apt because she does not have insurance Dr. Gean BirchwoodFrank Rowan

## 2017-04-05 NOTE — Telephone Encounter (Signed)
Patient would like carolyn to add to referral for Orthopaedics Please for Neck and spine . I called [facility] and referral for Neck and spine can be added to her apt on 04/15/2017 for her left foot.  Wylie HailMary Claire will you order and I will fax order over. Thanks Annabelle Harmanana.
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
## 2017-04-07 NOTE — Progress Notes (Signed)
I reviewed note and agree with plan.   VIKRAM R. PENUMALLI, MD 04/07/2017, 3:37 PM Certified in Neurology, Neurophysiology and Neuroimaging  [facility] [address], [address] [place], [place] [address] [phone]  

## 2017-04-12 ENCOUNTER — Encounter (HOSPITAL_COMMUNITY): Payer: Self-pay

## 2017-04-12 ENCOUNTER — Ambulatory Visit: Payer: Self-pay

## 2017-04-12 ENCOUNTER — Ambulatory Visit
Admission: RE | Admit: 2017-04-12 | Discharge: 2017-04-12 | Disposition: A | Discharge status: Home / Self Care | Payer: No Typology Code available for payment source | Source: Ambulatory Visit | Attending: Obstetrics and Gynecology | Admitting: Obstetrics and Gynecology

## 2017-04-12 ENCOUNTER — Ambulatory Visit (HOSPITAL_COMMUNITY)
Admission: RE | Admit: 2017-04-12 | Discharge: 2017-04-12 | Disposition: A | Discharge status: Home / Self Care | Payer: Self-pay | Source: Ambulatory Visit | Attending: Obstetrics and Gynecology | Admitting: Obstetrics and Gynecology

## 2017-04-12 VITALS — BP 114/72 | Ht 60.0 in | Wt 138.2 lb

## 2017-04-12 DIAGNOSIS — Z1239 Encounter for other screening for malignant neoplasm of breast: Secondary | ICD-10-CM

## 2017-04-12 DIAGNOSIS — N644 Mastodynia: Secondary | ICD-10-CM

## 2017-04-12 NOTE — Patient Instructions (Signed)
[facility]. Patient did not need a Pap smear today due to last Pap smear and HPV typing was 07/02/2016. Let her know BCCCP will cover Pap smears and HPV typing every 5 years unless has a history of abnormal Pap smears. [facility]. Appointment scheduled for Tuesday, April 12, 2017 at 1250. Aliveah Rosa Martinez-Encarnacion verbalized understanding.  Dantrell Schertzer, Kathaleen Maserhristine Poll, RN 2:46 PM

## 2017-04-12 NOTE — Progress Notes (Signed)
No complaints today. Complaints of bilateral breast pain x one year that comes and goes. Patient rates pain at a 3 out of 10.  Pap Smear: Pap smear not completed today. [facility]/2016 [facility] and normal with negative HPV. Per patient has no history of an abnormal Pap smear. Last Pap smear result is in EPIC.  Physical exam: Breasts Breasts symmetrical. No skin abnormalities bilateral breasts. No nipple retraction bilateral breasts. No nipple discharge bilateral breasts. No lymphadenopathy. No lumps palpated bilateral breasts. Complaints of left breast tenderness at 9 o'clock next to areola on exam. [facility]. Appointment scheduled for Tuesday, April 12, 2017 at 1250.        Pelvic/Bimanual No Pap smear completed today since last Pap smear and HPV typing was 07/02/2016. Pap smear not indicated per BCCCP guidelines.   Smoking History: Patient has never smoked.  Patient Navigation: Patient education provided. Access to services provided for patient through [facility] program. Spanish interpreter provided.  Used Spanish interpreter [employer] McReynolds from [facility].

## 2017-04-13 ENCOUNTER — Encounter (HOSPITAL_COMMUNITY): Payer: Self-pay | Admitting: *Deleted

## 2017-05-12 ENCOUNTER — Other Ambulatory Visit (HOSPITAL_COMMUNITY): Payer: Self-pay | Admitting: Orthopedic Surgery

## 2017-05-12 DIAGNOSIS — M79672 Pain in left foot: Secondary | ICD-10-CM

## 2017-05-16 ENCOUNTER — Ambulatory Visit (HOSPITAL_COMMUNITY)
Admission: RE | Admit: 2017-05-16 | Discharge: 2017-05-16 | Disposition: A | Discharge status: Home / Self Care | Payer: No Typology Code available for payment source | Source: Ambulatory Visit | Attending: Orthopedic Surgery | Admitting: Orthopedic Surgery

## 2017-05-16 DIAGNOSIS — M25475 Effusion, left foot: Secondary | ICD-10-CM | POA: Insufficient documentation

## 2017-05-16 DIAGNOSIS — M79672 Pain in left foot: Secondary | ICD-10-CM

## 2017-05-26 ENCOUNTER — Encounter: Payer: Self-pay | Admitting: Pediatric Intensive Care
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
